# Patient Record
Sex: Male | Born: 1975 | Race: Black or African American | Hispanic: No | Marital: Married | Smoking: Former smoker
Health system: Southern US, Community
[De-identification: ages and names within clinical notes are randomized; demographics above are authoritative.]

## PROBLEM LIST (undated history)

## (undated) DIAGNOSIS — I10 Essential (primary) hypertension: Secondary | ICD-10-CM

## (undated) DIAGNOSIS — G8929 Other chronic pain: Secondary | ICD-10-CM

## (undated) HISTORY — PX: TONSILLECTOMY: SUR1361

## (undated) HISTORY — PX: TOE SURGERY: SHX1073

## (undated) HISTORY — PX: ACHILLES TENDON SURGERY: SHX542

---

## 2008-10-20 DIAGNOSIS — I10 Essential (primary) hypertension: Secondary | ICD-10-CM | POA: Insufficient documentation

## 2009-08-03 ENCOUNTER — Emergency Department: Payer: Self-pay | Admitting: Emergency Medicine

## 2018-07-01 ENCOUNTER — Encounter (HOSPITAL_COMMUNITY): Payer: Self-pay

## 2018-07-01 ENCOUNTER — Ambulatory Visit (HOSPITAL_COMMUNITY)
Admission: EM | Admit: 2018-07-01 | Discharge: 2018-07-01 | Disposition: A | Discharge status: Home / Self Care | Payer: Medicare (Managed Care) | Attending: Internal Medicine | Admitting: Internal Medicine

## 2018-07-01 ENCOUNTER — Other Ambulatory Visit: Payer: Self-pay

## 2018-07-01 DIAGNOSIS — M5441 Lumbago with sciatica, right side: Secondary | ICD-10-CM | POA: Insufficient documentation

## 2018-07-01 DIAGNOSIS — G8929 Other chronic pain: Secondary | ICD-10-CM

## 2018-07-01 MED ORDER — TIZANIDINE HCL 4 MG PO TABS: 4.0000 mg | ORAL_TABLET | Freq: Every day | ORAL | 0 refills | Status: DC

## 2018-07-01 MED ORDER — METHYLPREDNISOLONE SODIUM SUCC 125 MG IJ SOLR
INTRAMUSCULAR | Status: AC
  Filled 2018-07-01: qty 2

## 2018-07-01 MED ORDER — METHYLPREDNISOLONE SODIUM SUCC 125 MG IJ SOLR
125.0000 mg | Freq: Once | INTRAMUSCULAR | Status: AC
  Administered 2018-07-01: 125 mg via INTRAMUSCULAR

## 2018-07-01 NOTE — ED Triage Notes (Signed)
Pt cc lower back pain  And  The pain is  radiating down his leg.

## 2018-07-01 NOTE — ED Provider Notes (Signed)
[facility]   [id number] 07/01/18 Arrival Time: 1201  CC: Back pain  SUBJECTIVE: History from: patient. Jerry [name] is a 42 y.o. male complains of right sided low back pain for the past few days.  Denies a precipitating event or specific injury.  Localizes the pain to the RT low back and radiates down his left leg.  Describes the pain as intermittent and sharp in character.  Has tried OTC medications without relief.  Denies aggravating factors.  Reports similar symptoms in the past and diagnosed with sciaitica.  Denies fever, chills, erythema, ecchymosis, effusion, weakness, loss of bowel or bladder function, saddle paresthesias, numbness and tingling.      ROS: As per HPI.  History reviewed. No pertinent past medical history. History reviewed. No pertinent surgical history. Not on File No current facility-administered medications on file prior to encounter.    No current outpatient medications on file prior to encounter.   Social History   Socioeconomic History  . Marital status: Single    Spouse name: Not on file  . Number of children: Not on file  . Years of education: Not on file  . Highest education level: Not on file  Occupational History  . Not on file  Social Needs  . Financial resource strain: Not on file  . Food insecurity:    Worry: Not on file    Inability: Not on file  . Transportation needs:    Medical: Not on file    Non-medical: Not on file  Tobacco Use  . Smoking status: Never Smoker  . Smokeless tobacco: Never Used  Substance and Sexual Activity  . Alcohol use: Not on file  . Drug use: Not on file  . Sexual activity: Not on file  Lifestyle  . Physical activity:    Days per week: Not on file    Minutes per session: Not on file  . Stress: Not on file  Relationships  . Social connections:    Talks on phone: Not on file    Gets together: Not on file    Attends religious service: Not on file    Active member of club or organization:  Not on file    Attends meetings of clubs or organizations: Not on file    Relationship status: Not on file  . Intimate partner violence:    Fear of current or ex partner: Not on file    Emotionally abused: Not on file    Physically abused: Not on file    Forced sexual activity: Not on file  Other Topics Concern  . Not on file  Social History Narrative  . Not on file   History reviewed. No pertinent family history.  OBJECTIVE:  Vitals:   07/01/18 1342 07/01/18 1344  BP:  (!) 169/77  Pulse:  66  Resp:  20  Temp:  98.2 F (36.8 C)  SpO2:  100%  Weight: 280 lb (127 kg)     General appearance: Alert; in no acute distress.  Head: NCAT Lungs: CTA bilaterally Heart: RRR.  Clear S1 and S2 without murmur, gallops, or rubs.  Radial pulses 2+ bilaterally. Musculoskeletal: Lumbar spine Inspection: Skin warm, dry, clear and intact without obvious erythema, effusion, or ecchymosis.  Palpation: Midly tender over lumbar spine and TTP over the RT paravertebral muscles  ROM: FROM active and passive Strength: 5/5 shld abduction, 5/5 shld adduction, 5/5 elbow flexion, 5/5 elbow extension, 5/5 grip strength, 5/5 hip flexion, 5/5 knee abduction, 5/5 knee adduction, 5/5  knee flexion, 5/5 knee extension, 5/5 dorsiflexion, 5/5 plantar flexion Skin: warm and dry Neurologic: Ambulates without difficulty; Sensation intact about the upper/ lower extremities Psychological: alert and cooperative; normal mood and affect  ASSESSMENT & PLAN:  1. Chronic bilateral low back pain with right-sided sciatica     Meds ordered this encounter  Medications  . methylPREDNISolone sodium succinate (SOLU-MEDROL) 125 mg/2 mL injection 125 mg  . tiZANidine (ZANAFLEX) 4 MG tablet    Sig: Take 1 tablet (4 mg total) by mouth at bedtime.    Dispense:  15 tablet    Refill:  0    Order Specific Question:   Supervising Provider    Answer:   Eustace MooreELSON, YVONNE SUE [[id number]]   Shot given in office Continue conservative  management of rest, ice, heat, and gentle stretches Take zanaflex at nighttime for symptomatic relief. Avoid driving or operating heavy machinery while using medication. Follow up with PCP if symptoms persist Return or go to the ER if you have any new or worsening symptoms (fever, chills, chest pain, abdominal pain, changes in bowel or bladder habits, pain radiating into lower legs, etc...)   Reviewed expectations re: course of current medical issues. Questions answered. Outlined signs and symptoms indicating need for more acute intervention. Patient verbalized understanding. After Visit Summary given.    Rennis HardingWurst, Shivali Quackenbush, PA-C 07/01/18 1436

## 2018-07-01 NOTE — Discharge Instructions (Signed)
Shot given in office Continue conservative management of rest, ice, heat, and gentle stretches Take zanaflex at nighttime for symptomatic relief. Avoid driving or operating heavy machinery while using medication. Follow up with PCP if symptoms persist Return or go to the ER if you have any new or worsening symptoms (fever, chills, chest pain, abdominal pain, changes in bowel or bladder habits, pain radiating into lower legs, etc...)

## 2018-08-12 DIAGNOSIS — K219 Gastro-esophageal reflux disease without esophagitis: Secondary | ICD-10-CM | POA: Insufficient documentation

## 2018-11-20 ENCOUNTER — Ambulatory Visit (HOSPITAL_COMMUNITY): Payer: Medicare Other | Admitting: Licensed Clinical Social Worker

## 2018-11-29 ENCOUNTER — Ambulatory Visit (HOSPITAL_COMMUNITY): Payer: Medicare Other | Admitting: Licensed Clinical Social Worker

## 2018-11-29 ENCOUNTER — Other Ambulatory Visit: Payer: Self-pay

## 2018-12-06 ENCOUNTER — Ambulatory Visit (INDEPENDENT_AMBULATORY_CARE_PROVIDER_SITE_OTHER): Payer: Medicaid Other | Admitting: Licensed Clinical Social Worker

## 2018-12-06 DIAGNOSIS — F4322 Adjustment disorder with anxiety: Secondary | ICD-10-CM

## 2018-12-06 NOTE — Progress Notes (Signed)
Comprehensive Clinical Assessment (CCA) Note  12/06/2018 Jerry [name] [id number]   Virtual Visit via Video Note  I connected with Jerry [name] on 12/06/18 at  1:00 PM EDT by a video enabled telemedicine application and verified that I am speaking with the correct person using two identifiers.   I discussed the limitations of evaluation and management by telemedicine and the availability of in person appointments. The patient expressed understanding and agreed to proceed.  I discussed the assessment and treatment plan with the patient. The patient was provided an opportunity to ask questions and all were answered. The patient agreed with the plan and demonstrated an understanding of the instructions.   The patient was advised to call back or seek an in-person evaluation if the symptoms worsen or if the condition fails to improve as anticipated.  I provided 55 minutes of non-face-to-face time during this encounter.   Angus [place]ms, LCSW    Visit Diagnosis:      ICD-10-CM   1. Adjustment disorder with anxious mood F43.22     CCA Part One  Part One has been completed on paper by the patient.  (See scanned document in Chart Review)  CCA Part Two A  Intake/Chief Complaint:  CCA Intake With Chief Complaint CCA Part Two Date: 12/06/18 CCA Part Two Time: 1300 Chief Complaint/Presenting Problem: "a lot of stress," creating problems in marriage Patients Currently Reported Symptoms/Problems: feels stressed all the time, can't stop thinking about the problems or enjoy the good times Collateral Involvement: wife Individual's Strengths: motivated for positive growth, supportive family Individual's Abilities: motivating to others, ordained as a Optician, dispensing, sings in a gospel group Type of Services Patient Feels Are Needed: marriage counseling Initial Clinical Notes/Concerns: history of anxiety due to losses, considering leaving, his mother is very involved in [place] as well and  wants her concerns addressed  Mental Health Symptoms Depression:  Depression: N/A  Mania:  Mania: N/A  Anxiety:   Anxiety: Worrying  Psychosis:  Psychosis: N/A  Trauma:  Trauma: N/A  Obsessions:  Obsessions: N/A  Compulsions:  Compulsions: N/A  Inattention:  Inattention: N/A  Hyperactivity/Impulsivity:  Hyperactivity/Impulsivity: N/A  Oppositional/Defiant Behaviors:  Oppositional/Defiant Behaviors: N/A  Borderline Personality:  Emotional Irregularity: N/A  Other Mood/Personality Symptoms:      Family and Psychosocial History: Family history Marital status: Married Number of Years Married: 0.75 What types of issues is patient dealing with in the relationship?: living with mother-in-law, feels there is no time that is just for him and his wife, he has not saved up for them to have a place of their own and wife brings that up to him, feels less of a man,  feels like he may be ready to leave the marriage Additional relationship information: mother says that wife tries to control him, mother says his life is in [place] and he needs to be there but wife wants to stay near her mom Are you sexually active?: Yes What is your sexual orientation?: straight Has your sexual activity been affected by drugs, alcohol, medication, or emotional stress?: no Does patient have children?: Yes How many children?: 1 How is patient's relationship with their children?: Wife has one daughter and 2 grandkids, they have good relationship but pt feels they are there too much  Childhood History:  Childhood History By whom was/is the patient raised?: Mother, Grandparents Additional childhood history information: dad was in and out of the home, grew up in [place], grandparents helped mother raise him, raised in the church with  family members in the church, grandfather was Hotel managermilitary Description of patient's relationship with caregiver when they were a child: close with mother and grandparents Patient's description of  current relationship with people who raised him/her: still very close, mom has concerns about marriage and wants him to live in [place] again Does patient have siblings?: No Did patient suffer any verbal/emotional/physical/sexual abuse as a child?: No Did patient suffer from severe childhood neglect?: No Has patient ever been sexually abused/assaulted/raped as an adolescent or adult?: No Was the patient ever a victim of a crime or a disaster?: No Witnessed domestic violence?: No Has patient been effected by domestic violence as an adult?: No  CCA Part Two B  Employment/Work Situation: Employment / Work Psychologist, occupationalituation Employment situation: Employed Where is patient currently employed?: 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?: No Are There Guns or Other Weapons in Your Home?: No  Education: Education Last Grade Completed: 12 Did Garment/textile technologistYou Graduate From [employer] School?: Yes Did Theme park managerYou Attend College?: No(Job [employer] (trade school)) Did You Have An Individualized Education Program (IIEP): No Did You Have Any Difficulty At [employer]?: No  Religion: Religion/Spirituality Are You A Religious Person?: Yes What is Your Religious Affiliation?: [employer] How Might This Affect Treatment?: preaches and very focused on his faith  Leisure/Recreation: Leisure / Recreation Leisure and Hobbies: [employer] rap group, cooking out, working out, watching Bible stories or Theatre stage managerpreachers on his phone  Exercise/Diet: Exercise/Diet Do You Exercise?: Yes What Type of Exercise Do You Do?: Run/Walk How Many Times a Week Do You Exercise?: 1-3 times a week Have You Gained or Lost A Significant Amount of Weight in the Past Six Months?: No Do You Follow a Special Diet?: No Do You Have Any  Trouble Sleeping?: Yes Explanation of Sleeping Difficulties: hard to get to sleep  CCA Part Two C  Alcohol/Drug Use: None CCA Part Three  ASAM's:  Six Dimensions of Multidimensional Assessment  Dimension 1:  Acute Intoxication and/or Withdrawal Potential:     Dimension 2:  Biomedical Conditions and Complications:     Dimension 3:  Emotional, Behavioral, or Cognitive Conditions and Complications:     Dimension 4:  Readiness to Change:     Dimension 5:  Relapse, Continued use, or Continued Problem Potential:     Dimension 6:  Recovery/Living Environment:      Substance use Disorder (SUD)    Social Function:  Social Functioning Social Maturity: Responsible Social Judgement: Normal  Stress:  Stress Stressors: Family conflict, Housing Patient Takes Medications The Way The Doctor Instructed?: Yes Priority Risk: Low Acuity  Risk Assessment- Self-Harm Potential: Risk Assessment For Self-Harm Potential Thoughts of Self-Harm: No current thoughts Method: No plan Availability of Means: No access/NA  Risk Assessment -Dangerous to Others Potential: Risk Assessment For Dangerous to Others Potential Method: No Plan Availability of Means: No access or NA Intent: Vague intent or NA Notification Required: No need or identified person  DSM5 Diagnoses: There are no active problems to display for this patient.   Patient Centered Plan: Patient is on the following Treatment Plan(s):  Adjustment Disorder Treatment Plan  Recommendations for Services/Supports/Treatments: Recommendations for Services/Supports/Treatments Recommendations For Services/Supports/Treatments: Individual Therapy  Treatment Plan Summary: Increase capacity for healthy adjustment to current life circumstances   Angus Palmsegina Alexander

## 2019-02-15 ENCOUNTER — Other Ambulatory Visit: Payer: Self-pay | Admitting: Family Medicine

## 2019-02-15 DIAGNOSIS — Z20822 Contact with and (suspected) exposure to covid-19: Secondary | ICD-10-CM

## 2019-02-18 ENCOUNTER — Other Ambulatory Visit: Payer: Self-pay

## 2019-02-18 DIAGNOSIS — Z20822 Contact with and (suspected) exposure to covid-19: Secondary | ICD-10-CM

## 2019-02-19 LAB — NOVEL CORONAVIRUS, NAA: SARS-CoV-2, NAA: NOT DETECTED

## 2019-04-11 ENCOUNTER — Other Ambulatory Visit: Payer: Self-pay

## 2019-04-11 ENCOUNTER — Ambulatory Visit
Admission: EM | Admit: 2019-04-11 | Discharge: 2019-04-11 | Disposition: A | Discharge status: Home / Self Care | Payer: Medicare Other

## 2019-04-11 DIAGNOSIS — I10 Essential (primary) hypertension: Secondary | ICD-10-CM

## 2019-04-11 DIAGNOSIS — M5442 Lumbago with sciatica, left side: Secondary | ICD-10-CM

## 2019-04-11 DIAGNOSIS — M5441 Lumbago with sciatica, right side: Secondary | ICD-10-CM

## 2019-04-11 HISTORY — DX: Essential (primary) hypertension: I10

## 2019-04-11 MED ORDER — TIZANIDINE HCL 4 MG PO TABS: 4.0000 mg | ORAL_TABLET | Freq: Every evening | ORAL | 0 refills | Status: DC | PRN

## 2019-04-11 MED ORDER — METHYLPREDNISOLONE SODIUM SUCC 125 MG IJ SOLR
125.0000 mg | Freq: Once | INTRAMUSCULAR | Status: AC
  Administered 2019-04-11: 125 mg via INTRAMUSCULAR

## 2019-04-11 MED ORDER — TIZANIDINE HCL 4 MG PO TABS: 4.0000 mg | ORAL_TABLET | Freq: Every evening | ORAL | 0 refills | Status: AC | PRN

## 2019-04-11 MED ORDER — NAPROXEN 500 MG PO TABS: 500.0000 mg | ORAL_TABLET | Freq: Two times a day (BID) | ORAL | 0 refills | Status: DC

## 2019-04-11 NOTE — ED Triage Notes (Signed)
Pt presents to UC w/ c/o sciatic pain since x2 weeks. Pt reports pain in lower back that shoots down both legs. Pt has dealt with sciatic pain before.

## 2019-04-11 NOTE — Discharge Instructions (Signed)
Take muscle relaxer as needed for severe pain, spasm. May ice, rest, elevate the area(s) of pain.  You may also use hot compresses/warm wash rags to relieve muscle tightness. May use OTC Tylenol, ibuprofen as needed for pain. Return if you develop worsening pain, chest pain, difficulty breathing, change in bowel or bladder habit.

## 2019-04-11 NOTE — ED Provider Notes (Signed)
[facility]    CSN: [id number] Arrival date & time: 04/11/19  1116      History   Chief Complaint Chief Complaint  Patient presents with  . sciatic pain    HPI Jerry [name] is a 43 y.o. male history of hypertension presenting for 2-week course of bilateral low back pain with radiation down both legs to the back of his knees.  Patient states he seemed before for this: Record review done by me at time of appointment.  Patient seen in urgent care setting on 07/01/2018 for chronic bilateral low back pain with bilateral sciatica: Treated with prednisone burst, tizanidine.  Patient reports resolution of symptoms thereafter.  Patient denies trauma, inciting event for this flare.  Denies saddle area anesthesia, change in bowel or bladder habit.   Past Medical History:  Diagnosis Date  . Hypertension     There are no active problems to display for this patient.   Past Surgical History:  Procedure Laterality Date  . ACHILLES TENDON SURGERY    . TOE SURGERY         Home Medications    Prior to Admission medications   Medication Sig Start Date End Date Taking? Authorizing Provider  oxyCODONE-acetaminophen (PERCOCET/ROXICET) 5-325 MG tablet Take by mouth every 4 (four) hours as needed for severe pain.   Yes [provider]  naproxen (NAPROSYN) 500 MG tablet Take 1 tablet (500 mg total) by mouth 2 (two) times daily. 04/11/19   Hall-Potvin, [place], PA-C  tiZANidine (ZANAFLEX) 4 MG tablet Take 1 tablet (4 mg total) by mouth at bedtime as needed for up to 7 days for muscle spasms. 04/11/19 04/18/19  Hall-Potvin, [place], PA-C    Family History History reviewed. No pertinent family history.  Social History Social History   Tobacco Use  . Smoking status: Never Smoker  . Smokeless tobacco: Never Used  Substance Use Topics  . Alcohol use: Never    Frequency: Never  . Drug use: Not on file     Allergies   Patient has no known allergies.   Review  of Systems Review of Systems  Constitutional: Negative for fatigue and fever.  Respiratory: Negative for cough and shortness of breath.   Cardiovascular: Negative for chest pain and palpitations.  Musculoskeletal: Positive for back pain. Negative for neck pain.  Neurological: Negative for weakness and numbness.     Physical Exam Triage Vital Signs ED Triage Vitals  Enc Vitals Group     BP 04/11/19 1127 (!) 146/81     Pulse Rate 04/11/19 1127 85     Resp 04/11/19 1127 16     Temp 04/11/19 1127 (!) 97.4 F (36.3 C)     Temp Source 04/11/19 1127 Oral     SpO2 04/11/19 1127 97 %     Weight --      Height --      Head Circumference --      Peak Flow --      Pain Score 04/11/19 1136 9     Pain Loc --      Pain Edu? --      Excl. in GC? --    No data found.  Updated Vital Signs BP (!) 146/81 (BP Location: Left Arm)   Pulse 85   Temp (!) 97.4 F (36.3 C) (Oral)   Resp 16   SpO2 97%    Physical Exam Constitutional:      General: He is not in acute distress. HENT:  Head: Normocephalic and atraumatic.  Eyes:     General: No scleral icterus.    Pupils: Pupils are equal, round, and reactive to light.  Cardiovascular:     Rate and Rhythm: Normal rate.  Pulmonary:     Effort: Pulmonary effort is normal. No respiratory distress.     Breath sounds: No wheezing.  Musculoskeletal:     Comments: No obvious bony deformity or scoliosis of lumbar spine.  No PSIS tenderness.  Patient with bilateral paraspinal tenderness: No spinous process tenderness.  No fluctuance, warmth, rash appreciated.  Patient with bilateral gluteal TTP.  Negative straight leg raise bilaterally.  Skin:    General: Skin is warm.     Coloration: Skin is not jaundiced.     Findings: No bruising.  Neurological:     General: No focal deficit present.     Mental Status: He is alert.     Sensory: No sensory deficit.     Motor: No weakness.     Coordination: Coordination normal.     Deep Tendon Reflexes:  Reflexes normal.      UC Treatments / Results  Labs (all labs ordered are listed, but only abnormal results are displayed) Labs Reviewed - No data to display  EKG   Radiology No results found.  Procedures Procedures (including critical care time)  Medications Ordered in UC Medications  methylPREDNISolone sodium succinate (SOLU-MEDROL) 125 mg/2 mL injection 125 mg (125 mg Intramuscular Given 04/11/19 1210)    Initial Impression / Assessment and Plan / UC Course  I have reviewed the triage vital signs and the nursing notes.  Pertinent labs & imaging results that were available during my care of the patient were reviewed by me and considered in my medical decision making (see chart for details).     Patient given IM Solu-Medrol which he tolerated well.  Short-term, low-dose muscle relaxer refill provided.  Patient to take Naprosyn twice daily for the next week for additional relief.  Sports med rehab exercises provided as well as contact information for sports medicine should issue recur/become more frequent.  Return precautions discussed, patient verbalized understanding and is agreeable to plan. Final Clinical Impressions(s) / UC Diagnoses   Final diagnoses:  Acute bilateral low back pain with bilateral sciatica     Discharge Instructions     Take muscle relaxer as needed for severe pain, spasm. May ice, rest, elevate the area(s) of pain.  You may also use hot compresses/warm wash rags to relieve muscle tightness. May use OTC Tylenol, ibuprofen as needed for pain. Return if you develop worsening pain, chest pain, difficulty breathing, change in bowel or bladder habit.    ED Prescriptions    Medication Sig Dispense Auth. Provider   tiZANidine (ZANAFLEX) 4 MG tablet  (Status: Discontinued) Take 1 tablet (4 mg total) by mouth at bedtime as needed for up to 7 days for muscle spasms. 7 tablet Hall-Potvin, [place], PA-C   naproxen (NAPROSYN) 500 MG tablet Take 1 tablet  (500 mg total) by mouth 2 (two) times daily. 30 tablet Hall-Potvin, [place], PA-C   tiZANidine (ZANAFLEX) 4 MG tablet Take 1 tablet (4 mg total) by mouth at bedtime as needed for up to 7 days for muscle spasms. 7 tablet Hall-Potvin, [place], PA-C     PDMP not reviewed this encounter.   Neldon Mc [place], [place] 04/11/19 1938

## 2019-04-19 ENCOUNTER — Encounter: Payer: Self-pay | Admitting: Cardiology

## 2019-04-19 ENCOUNTER — Ambulatory Visit (INDEPENDENT_AMBULATORY_CARE_PROVIDER_SITE_OTHER): Payer: Medicare Other | Admitting: Cardiology

## 2019-04-19 ENCOUNTER — Other Ambulatory Visit: Payer: Self-pay

## 2019-04-19 VITALS — BP 152/96 | HR 74 | Ht 67.0 in | Wt 293.0 lb

## 2019-04-19 DIAGNOSIS — R9431 Abnormal electrocardiogram [ECG] [EKG]: Secondary | ICD-10-CM | POA: Diagnosis not present

## 2019-04-19 DIAGNOSIS — R0683 Snoring: Secondary | ICD-10-CM | POA: Diagnosis not present

## 2019-04-19 DIAGNOSIS — R600 Localized edema: Secondary | ICD-10-CM | POA: Diagnosis not present

## 2019-04-19 DIAGNOSIS — E119 Type 2 diabetes mellitus without complications: Secondary | ICD-10-CM | POA: Insufficient documentation

## 2019-04-19 DIAGNOSIS — I10 Essential (primary) hypertension: Secondary | ICD-10-CM

## 2019-04-19 MED ORDER — SPIRONOLACTONE 50 MG PO TABS: 50.0000 mg | ORAL_TABLET | Freq: Every day | ORAL | 3 refills | Status: DC

## 2019-04-19 NOTE — Patient Instructions (Signed)

## 2019-04-19 NOTE — Progress Notes (Signed)
Patient referred by Virgilio Belling, PA-C for hypertension  Subjective:   Jerry [name], male    DOB: [birth year], 43 y.o.   MRN: [id number]   Chief Complaint  Patient presents with  . Hypertension  . Abnormal ECG    HPI  43 y.o. African American male with uncontrolled hypertension, moderate obesity.  Patient is a Arts administrator, and used to be a Camera operator. He wants to get back into power lifting. He has had uncontrolled hypertension for almost 20 years. He has leg edema, but denies any chest pain, exertional dyspnea. He is trying to make diet and lifestyle changes. He endorses snoring, but has not had a sleep study.   Past Medical History:  Diagnosis Date  . Hypertension      Past Surgical History:  Procedure Laterality Date  . ACHILLES TENDON SURGERY    . TOE SURGERY       Social History   Socioeconomic History  . Marital status: Single    Spouse name: Not on file  . Number of children: Not on file  . Years of education: Not on file  . Highest education level: Not on file  Occupational History  . Not on file  Social Needs  . Financial resource strain: Not on file  . Food insecurity    Worry: Not on file    Inability: Not on file  . Transportation needs    Medical: Not on file    Non-medical: Not on file  Tobacco Use  . Smoking status: Never Smoker  . Smokeless tobacco: Never Used  Substance and Sexual Activity  . Alcohol use: Never    Frequency: Never  . Drug use: Not on file  . Sexual activity: Not on file  Lifestyle  . Physical activity    Days per week: Not on file    Minutes per session: Not on file  . Stress: Not on file  Relationships  . Social Musician on phone: Not on file    Gets together: Not on file    Attends religious service: Not on file    Active member of club or organization: Not on file    Attends meetings of clubs or organizations: Not on file    Relationship status: Not on file  . Intimate  partner violence    Fear of current or ex partner: Not on file    Emotionally abused: Not on file    Physically abused: Not on file    Forced sexual activity: Not on file  Other Topics Concern  . Not on file  Social History Narrative  . Not on file     Family History  Problem Relation Age of Onset  . Heart attack Maternal Grandmother   . Heart attack Paternal Grandmother      Current Outpatient Medications on File Prior to Visit  Medication Sig Dispense Refill  . naproxen (NAPROSYN) 500 MG tablet Take 1 tablet (500 mg total) by mouth 2 (two) times daily. 30 tablet 0  . oxyCODONE-acetaminophen (PERCOCET/ROXICET) 5-325 MG tablet Take by mouth every 4 (four) hours as needed for severe pain.     No current facility-administered medications on file prior to visit.     Cardiovascular studies:  EKG 04/19/2019:  Sinus rhythm 90 bpm. Incomplete right bundle branch block  Left anterior fascicular block.  Left ventricular hypertrophy Biatrial enlargement.  Poor R wave progression.  Recent labs: Not available.    Review of  Systems  Constitution: Negative for decreased appetite, malaise/fatigue, weight gain and weight loss.  HENT: Negative for congestion.   Eyes: Negative for visual disturbance.  Cardiovascular: Positive for leg swelling. Negative for chest pain, dyspnea on exertion, palpitations and syncope.  Respiratory: Negative for cough.   Endocrine: Negative for cold intolerance.  Hematologic/Lymphatic: Does not bruise/bleed easily.  Skin: Negative for itching and rash.  Musculoskeletal: Negative for myalgias.  Gastrointestinal: Negative for abdominal pain, nausea and vomiting.  Genitourinary: Negative for dysuria.  Neurological: Negative for dizziness and weakness.  Psychiatric/Behavioral: The patient is not nervous/anxious.   All other systems reviewed and are negative.        Vitals:   04/19/19 1340  BP: (!) 162/89  Pulse: 94  SpO2: 98%     Body mass  index is 45.89 kg/m. Filed Weights   04/19/19 1340  Weight: 293 lb (132.9 kg)     Objective:   Physical Exam  Constitutional: He is oriented to person, place, and time. He appears well-developed and well-nourished. No distress.  Morbidly obese  HENT:  Head: Normocephalic and atraumatic.  Eyes: Pupils are equal, round, and reactive to light. Conjunctivae are normal.  Neck: No JVD present.  Cardiovascular: Normal rate, regular rhythm and intact distal pulses.  No murmur heard. Pulmonary/Chest: Effort normal and breath sounds normal. He has no wheezes. He has no rales.  Abdominal: Soft. Bowel sounds are normal. There is no rebound.  Musculoskeletal:        General: Edema (1+ b/l) present.  Lymphadenopathy:    He has no cervical adenopathy.  Neurological: He is alert and oriented to person, place, and time. No cranial nerve deficit.  Skin: Skin is warm and dry.  Psychiatric: He has a normal mood and affect.  Nursing note and vitals reviewed.         Assessment & Recommendations:   43 y.o. African American male with uncontrolled hypertension, moderate obesity.  Hypertension: Possible secondary hypertension. 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 He has undergone workup with his PCP-including renin/angiotensins. Results not available to me. Recommend echocardiogram, renal artery duplex, and referral to sleep study. Continue current antihypertensive therapy + added spironolactone 50 mg daily. BMP in 1 week. F/ in 4 weeks.   Discussed diet and lifestyle changes, including portion control, low salt diet, regular exercise.    Thank you for referring the patient to [place]. Please feel free to contact with any questions.  Nigel Mormon, MD [facility] Pager: [phone] Office: [phone] If no answer Cell [phone]

## 2019-04-21 ENCOUNTER — Encounter: Payer: Self-pay | Admitting: Cardiology

## 2019-04-23 NOTE — Progress Notes (Signed)
Aldosterone results are not back on this scan. Can you follow up please?  Thanks MJP

## 2019-04-26 ENCOUNTER — Other Ambulatory Visit: Payer: Self-pay

## 2019-04-26 ENCOUNTER — Ambulatory Visit (INDEPENDENT_AMBULATORY_CARE_PROVIDER_SITE_OTHER): Payer: Medicare Other

## 2019-04-26 DIAGNOSIS — I10 Essential (primary) hypertension: Secondary | ICD-10-CM | POA: Diagnosis not present

## 2019-04-27 LAB — BASIC METABOLIC PANEL
BUN/Creatinine Ratio: 17 (ref 9–20)
BUN: 20 mg/dL (ref 6–24)
CO2: 21 mmol/L (ref 20–29)
Calcium: 9.3 mg/dL (ref 8.7–10.2)
Chloride: 105 mmol/L (ref 96–106)
Creatinine, Ser: 1.17 mg/dL (ref 0.76–1.27)
GFR calc Af Amer: 87 mL/min/{1.73_m2} (ref >59)
GFR calc non Af Amer: 75 mL/min/{1.73_m2} (ref >59)
Glucose: 86 mg/dL (ref 65–99)
Potassium: 3.8 mmol/L (ref 3.5–5.2)
Sodium: 138 mmol/L (ref 134–144)

## 2019-04-29 NOTE — Progress Notes (Signed)
Left detailed vm °

## 2019-05-01 ENCOUNTER — Telehealth: Payer: Self-pay

## 2019-05-01 NOTE — Telephone Encounter (Signed)
-----   Message from [facility], MD sent at 04/28/2019  9:10 PM EDT ----- Could not visualize renal arteries due to body habitus.  Thanks MJP

## 2019-05-16 NOTE — Progress Notes (Signed)
Patient referred by Andria Frames, PA-C for hypertension  Subjective:   Jerry [name], male    DOB: 1976-04-03, 43 y.o.   MRN: [id number]   Chief Complaint  Patient presents with  . Hypertension  . Leg Swelling  . Follow-up    4 week    HPI   43 y.o. African American male with uncontrolled hypertension, moderate obesity.  Echocardiogram showed normal EF, grade 1 diastolic dysfunction, no significant valvular abnormality.  Renal artery duplex was limited in its assessment due to body habitus. Aldosterone was normal. Sleep study is pending.   Blood pressure is improved, although not normal. He has made changes to his lifestyle  with diet and regular exercise. Leg edema has significantly improved. BFNG    Initial consult note (04/19/2019): Patient is a Agricultural consultant, and used to be a power lifter. He wants to get back into power lifting. He has had uncontrolled hypertension for almost 20 years. He has leg edema, but denies any chest pain, exertional dyspnea. He is trying to make diet and lifestyle changes. He endorses snoring, but has not had a sleep study.   Past Medical History:  Diagnosis Date  . Hypertension      Past Surgical History:  Procedure Laterality Date  . ACHILLES TENDON SURGERY    . TOE SURGERY       Social History   Socioeconomic History  . Marital status: Married    Spouse name: Not on file  . Number of children: 0  . Years of education: Not on file  . Highest education level: Not on file  Occupational History  . Not on file  Social Needs  . Financial resource strain: Not on file  . Food insecurity    Worry: Not on file    Inability: Not on file  . Transportation needs    Medical: Not on file    Non-medical: Not on file  Tobacco Use  . Smoking status: Former Smoker    Years: 1.00    Types: Cigarettes  . Smokeless tobacco: Never Used  . Tobacco comment: few a day   Substance and Sexual Activity  . Alcohol use: Never   Frequency: Never  . Drug use: Never  . Sexual activity: Not on file  Lifestyle  . Physical activity    Days per week: Not on file    Minutes per session: Not on file  . Stress: Not on file  Relationships  . Social Herbalist on phone: Not on file    Gets together: Not on file    Attends religious service: Not on file    Active member of club or organization: Not on file    Attends meetings of clubs or organizations: Not on file    Relationship status: Not on file  . Intimate partner violence    Fear of current or ex partner: Not on file    Emotionally abused: Not on file    Physically abused: Not on file    Forced sexual activity: Not on file  Other Topics Concern  . Not on file  Social History Narrative  . Not on file     Family History  Problem Relation Age of Onset  . Heart attack Maternal Grandmother   . Heart attack Paternal Grandmother      Current Outpatient Medications on File Prior to Visit  Medication Sig Dispense Refill  . amLODipine (NORVASC) 5 MG tablet Take 5 mg by  mouth daily.    . cetirizine (ZYRTEC) 10 MG tablet Take 10 mg by mouth daily.    Marland Kitchen DICLOFENAC PO Take by mouth.    . ergocalciferol (VITAMIN D2) 1.25 MG (50000 UT) capsule Take 50,000 Units by mouth once a week.    . hydrALAZINE (APRESOLINE) 100 MG tablet Take 100 mg by mouth 2 (two) times daily.     Marland Kitchen losartan (COZAAR) 100 MG tablet Take 100 mg by mouth daily.    Marland Kitchen omeprazole (PRILOSEC) 40 MG capsule Take 40 mg by mouth daily.    Marland Kitchen spironolactone (ALDACTONE) 50 MG tablet Take 1 tablet (50 mg total) by mouth daily. 30 tablet 3  . tamsulosin (FLOMAX) 0.4 MG CAPS capsule Take 0.4 mg by mouth daily.     No current facility-administered medications on file prior to visit.     Cardiovascular studies:  Echocardiogram 04/26/2019: Left ventricle cavity is normal in size. Moderate concentric hypertrophy of the left ventricle. Normal LV systolic function with EF 59%. Normal global wall  motion. Doppler evidence of grade I (impaired) diastolic dysfunction, normal LAP.  No significant valvular abnormality.  Normal right atrial pressure.   Renal artery duplex  04/26/2019: Renal length is within normal limits for both kidneys. Normal abdominal aorta flow velocities noted. Renal artery Dopplers were unobtainable due to poor image quality due to patient's body habitus. Patient 5'7'' and 300 pounds.  EKG 04/19/2019:  Sinus rhythm 90 bpm. Incomplete right bundle branch block  Left anterior fascicular block.  Left ventricular hypertrophy Biatrial enlargement.  Poor R wave progression.  Recent labs: 04/26/2019: Glucose 86. BUN/Cr 20/1.17. eGFR 87. Na/K 138/3.8.  04/05/2019: Aldosterone 14 ng/dL, normal   Review of Systems  Constitution: Negative for decreased appetite, malaise/fatigue, weight gain and weight loss.  HENT: Negative for congestion.   Eyes: Negative for visual disturbance.  Cardiovascular: Positive for leg swelling. Negative for chest pain, dyspnea on exertion, palpitations and syncope.  Respiratory: Negative for cough.   Endocrine: Negative for cold intolerance.  Hematologic/Lymphatic: Does not bruise/bleed easily.  Skin: Negative for itching and rash.  Musculoskeletal: Negative for myalgias.  Gastrointestinal: Negative for abdominal pain, nausea and vomiting.  Genitourinary: Negative for dysuria.  Neurological: Negative for dizziness and weakness.  Psychiatric/Behavioral: The patient is not nervous/anxious.   All other systems reviewed and are negative.        Vitals:   05/24/19 1437  BP: (!) 151/81  Pulse: 84  SpO2: 98%     Body mass index is 44.89 kg/m. Filed Weights   05/24/19 1437  Weight: 286 lb 9.6 oz (130 kg)     Objective:   Physical Exam  Constitutional: He is oriented to person, place, and time. He appears well-developed and well-nourished. No distress.  Morbidly obese  HENT:  Head: Normocephalic and atraumatic.  Eyes:  Pupils are equal, round, and reactive to light. Conjunctivae are normal.  Neck: No JVD present.  Cardiovascular: Normal rate, regular rhythm and intact distal pulses.  No murmur heard. Pulmonary/Chest: Effort normal and breath sounds normal. He has no wheezes. He has no rales.  Abdominal: Soft. Bowel sounds are normal. There is no rebound.  Musculoskeletal:        General: Edema (Trace  b/l) present.  Lymphadenopathy:    He has no cervical adenopathy.  Neurological: He is alert and oriented to person, place, and time. No cranial nerve deficit.  Skin: Skin is warm and dry.  Psychiatric: He has a normal mood and affect.  Nursing note  and vitals reviewed.         Assessment & Recommendations:   43 y.o. African American male with uncontrolled hypertension, moderate obesity.  Hypertension: No secondary hypertension cause found.  Suboptimal, but much improved.  Continue current antihypertensive therapy. Encourage undergoing sleep study. Congratulated him on weight loss. Continue regular physical activity.  I will see him on as needed basis.   Nigel Mormon, MD [facility] Pager: [phone] Office: [phone] If no answer Cell [phone]

## 2019-05-20 ENCOUNTER — Ambulatory Visit: Payer: Medicaid Other | Admitting: Neurology

## 2019-05-20 ENCOUNTER — Other Ambulatory Visit: Payer: Self-pay

## 2019-05-20 ENCOUNTER — Encounter: Payer: Self-pay | Admitting: Neurology

## 2019-05-20 VITALS — BP 172/102 | HR 81 | Temp 97.0°F | Ht 67.0 in | Wt 288.0 lb

## 2019-05-20 DIAGNOSIS — G4719 Other hypersomnia: Secondary | ICD-10-CM

## 2019-05-20 DIAGNOSIS — Z6841 Body Mass Index (BMI) 40.0 and over, adult: Secondary | ICD-10-CM

## 2019-05-20 DIAGNOSIS — R351 Nocturia: Secondary | ICD-10-CM | POA: Diagnosis not present

## 2019-05-20 DIAGNOSIS — G4733 Obstructive sleep apnea (adult) (pediatric): Secondary | ICD-10-CM

## 2019-05-20 DIAGNOSIS — R002 Palpitations: Secondary | ICD-10-CM

## 2019-05-20 NOTE — Patient Instructions (Signed)

## 2019-05-20 NOTE — Progress Notes (Signed)
Subjective:    Patient ID: TRACKER MANCE is a 43 y.o. male.  HPI     Star Age, MD, PhD [facility] [address], [address] [place], [place] [address]   Dear Dr. Virgina Jock,  I saw your patient, Stephfon Bovey, upon your kind request to my sleep clinic today for initial consultation of his sleep disorder, in particular, concern for underlying obstructive sleep apnea.  The patient is accompanied by his wife today.  As you know, Mr. Reesman is a 43 year old right-handed gentleman with an underlying medical history of hypertension, abnormal EKG, and morbid obesity with a BMI of over 40, who reports snoring and excessive daytime somnolence and a prior Dx of OSA.  I reviewed your office note from 04/19/2019.  He had a sleep study several years ago and was given a CPAP machine.  He believes sleep study testing was in [place].  It looks like he had a baseline sleep study in September 2016 but I could not get to the results.  He says that he either lost or misplaced his CPAP machine.  When he was on it in the past he did feel an improvement.  He endorses restless sleep and sleep disruption, has significant nocturia about 4-5 times per average night.  His wife reports that he snores loudly and also has pauses in his breathing.  Epworth sleepiness score is 13 out of 24, fatigue severity score is 38 out of 63.  He denies recurrent morning headaches or a family history of sleep apnea.  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  He is a non-smoker.  He drinks alcohol very occasionally.  He does not drink caffeine on a day-to-day basis, has reduced his soda intake quite significantly.  He tries to be in bed between 930 and 10 and rise time is around 530.  He lives with his wife and mother-in-law, he has no biological children.    His Past Medical History Is Significant For: Past Medical History:  Diagnosis Date  . Hypertension     His Past Surgical  History Is Significant For: Past Surgical History:  Procedure Laterality Date  . ACHILLES TENDON SURGERY    . TOE SURGERY      His Family History Is Significant For: Family History  Problem Relation Age of Onset  . Heart attack Maternal Grandmother   . Heart attack Paternal Grandmother     His Social History Is Significant For: Social History   Socioeconomic History  . Marital status: Married    Spouse name: Not on file  . Number of children: 0  . Years of education: Not on file  . Highest education level: Not on file  Occupational History  . Not on file  Social Needs  . Financial resource strain: Not on file  . Food insecurity    Worry: Not on file    Inability: Not on file  . Transportation needs    Medical: Not on file    Non-medical: Not on file  Tobacco Use  . Smoking status: Former Smoker    Years: 1.00    Types: Cigarettes  . Smokeless tobacco: Never Used  . Tobacco comment: few a day   Substance and Sexual Activity  . Alcohol use: Never    Frequency: Never  . Drug use: Never  . Sexual activity: Not on file  Lifestyle  . Physical activity    Days per week: Not on file    Minutes  per session: Not on file  . Stress: Not on file  Relationships  . Social Musician on phone: Not on file    Gets together: Not on file    Attends religious service: Not on file    Active member of club or organization: Not on file    Attends meetings of clubs or organizations: Not on file    Relationship status: Not on file  Other Topics Concern  . Not on file  Social History Narrative  . Not on file    His Allergies Are:  No Known Allergies:   His Current Medications Are:  Outpatient Encounter Medications as of 05/20/2019  Medication Sig  . amLODipine (NORVASC) 5 MG tablet Take 5 mg by mouth daily.  . cetirizine (ZYRTEC) 10 MG tablet Take 10 mg by mouth daily.  Marland Kitchen DICLOFENAC PO Take by mouth.  . ergocalciferol (VITAMIN D2) 1.25 MG (50000 UT) capsule Take  50,000 Units by mouth once a week.  . hydrALAZINE (APRESOLINE) 100 MG tablet Take 100 mg by mouth 2 (two) times daily.   Marland Kitchen losartan (COZAAR) 100 MG tablet Take 100 mg by mouth daily.  Marland Kitchen omeprazole (PRILOSEC) 40 MG capsule Take 40 mg by mouth daily.  Marland Kitchen spironolactone (ALDACTONE) 50 MG tablet Take 1 tablet (50 mg total) by mouth daily.  . tamsulosin (FLOMAX) 0.4 MG CAPS capsule Take 0.4 mg by mouth daily.  . [DISCONTINUED] amLODipine (NORVASC) 10 MG tablet Take 10 mg by mouth daily.  . [DISCONTINUED] naproxen (NAPROSYN) 500 MG tablet Take 1 tablet (500 mg total) by mouth 2 (two) times daily.  . [DISCONTINUED] omeprazole (PRILOSEC) 20 MG capsule Take 20 mg by mouth daily.  . [DISCONTINUED] oxyCODONE-acetaminophen (PERCOCET/ROXICET) 5-325 MG tablet Take by mouth every 4 (four) hours as needed for severe pain.   No facility-administered encounter medications on file as of 05/20/2019.   :  Review of Systems:  Out of a complete 14 point review of systems, all are reviewed and negative with the exception of these symptoms as listed below: Review of Systems  Neurological:       Pt presents today to discuss his sleep. Pt has had a sleep study about 5 years ago and started on cpap but stopped using it and does not have it any longer. Pt does endorse snoring.  Epworth Sleepiness Scale 0= would never doze 1= slight chance of dozing 2= moderate chance of dozing 3= high chance of dozing  Sitting and reading: 2 Watching TV: 2 Sitting inactive in a public place (ex. Theater or meeting): 2 As a passenger in a car for an hour without a break: 1 Lying down to rest in the afternoon: 3 Sitting and talking to someone: 0 Sitting quietly after lunch (no alcohol): 3 In a car, while stopped in traffic: 0 Total: 13     Objective:  Neurological Exam  Physical Exam Physical Examination:   Vitals:   05/20/19 0905  BP: (!) 172/102  Pulse: 81  Temp: (!) 97 F (36.1 C)    General Examination: The  patient is a very pleasant 43 y.o. male in no acute distress. He appears well-developed and well-nourished and well groomed.   HEENT: Normocephalic, atraumatic, pupils are equal, round and reactive to light, extraocular tracking is Well preserved, no nystagmus, hearing is grossly intact.  Face is symmetric with normal facial animation, speech is clear without dysarthria, hypophonia or voice tremor.  There are no carotid bruits.  Airway examination reveals mild  mouth dryness, adequate dental hygiene, minimal overbite, moderate airway crowding secondary to smaller airway entry and wider uvula.  Tongue protrudes centrally in palate elevates symmetrically.  Neck circumference is 18-1/4 inches.  Chest: Clear to auscultation without wheezing, rhonchi or crackles noted.  Heart: S1+S2+0, regular and normal without murmurs, rubs or gallops noted.   Abdomen: Soft, non-tender and non-distended with normal bowel sounds appreciated on auscultation.  Extremities: There is trace edema in the distal lower extremities bilaterally.   Skin: Warm and dry without trophic changes noted.   Musculoskeletal: exam reveals no obvious joint deformities, tenderness or joint swelling or erythema.   Neurologically:  Mental status: The patient is awake, alert and oriented in all 4 spheres. His immediate and remote memory, attention, language skills and fund of knowledge are appropriate. There is no evidence of aphasia, agnosia, apraxia or anomia. Speech is clear with normal prosody and enunciation. Thought process is linear. Mood is normal and affect is normal.  Cranial nerves II - XII are as described above under HEENT exam.  Motor exam: Normal bulk, strength and tone is noted. There is no tremor. Romberg is negative. Fine motor skills and coordination: grossly intact.  Cerebellar testing: No dysmetria or intention tremor on finger to nose testing. Heel to shin is unremarkable bilaterally. There is no truncal or gait ataxia.   Sensory exam: intact to light touch in the upper and lower extremities.  Gait, station and balance: He stands easily. No veering to one side is noted. No leaning to one side is noted. Posture is age-appropriate and stance is narrow based. Gait shows normal stride length and normal pace. No problems turning are noted. Tandem walk is challenging, but doable.   Assessment and Plan:  In summary, Roshawn Hinton DyerJ Beckum is a very pleasant 43 y.o.-year old male s wife today.  As you know, Mr. Marvel PlanWhitehead is a 43 year old right-handed gentleman with an underlying medical history of hypertension, abnormal EKG, and morbid obesity with a BMI of over 40, whose history and physical exam are concerning for obstructive sleep apnea (OSA). I had a long chat with the patient and his wife about my findings and the diagnosis of OSA, its prognosis and treatment options. We talked about medical treatments, surgical interventions and non-pharmacological approaches. I explained in particular the risks and ramifications of untreated moderate to severe OSA, especially with respect to developing cardiovascular disease down the Road, including congestive heart failure, difficult to treat hypertension, cardiac arrhythmias, or stroke. Even type 2 diabetes has, in part, been linked to untreated OSA. Symptoms of untreated OSA include daytime sleepiness, memory problems, mood irritability and mood disorder such as depression and anxiety, lack of energy, as well as recurrent headaches, especially morning headaches. We talked about trying to maintain a healthy lifestyle in general, as well as the importance of weight control. We also talked about the importance of good sleep hygiene. I recommended the following at this time: sleep study.    I explained the sleep test procedure to the patient and also outlined possible surgical and non-surgical treatment options of OSA, including the use of a custom-made dental device (which would require a  referral to a specialist dentist or oral surgeon), upper airway surgical options, such as traditional UPPP or a novel less invasive surgical option in the form of Inspire hypoglossal nerve stimulation (which would involve a referral to an ENT surgeon). I also explained the CPAP treatment option to the patient, who indicated that he would be willing to try  CPAP if the need arises. I explained the importance of being compliant with PAP treatment, not only for insurance purposes but primarily to improve His symptoms, and for the patient's long term health benefit, including to reduce His cardiovascular risks. I answered all their questions today and the patient and his wife were in agreement. I plan to see him back after the sleep study is completed and encouraged him to call with any interim questions, concerns, problems or updates.   Thank you very much for allowing me to participate in the care of this nice patient. If I can be of any further assistance to you please do not hesitate to call me at [phone].  Sincerely,   Huston Foley, MD, PhD

## 2019-05-24 ENCOUNTER — Other Ambulatory Visit: Payer: Self-pay

## 2019-05-24 ENCOUNTER — Ambulatory Visit (INDEPENDENT_AMBULATORY_CARE_PROVIDER_SITE_OTHER): Payer: Medicare Other | Admitting: Cardiology

## 2019-05-24 ENCOUNTER — Encounter: Payer: Self-pay | Admitting: Cardiology

## 2019-05-24 VITALS — BP 151/81 | HR 84 | Ht 67.0 in | Wt 286.6 lb

## 2019-05-24 DIAGNOSIS — I10 Essential (primary) hypertension: Secondary | ICD-10-CM | POA: Diagnosis not present

## 2019-05-24 DIAGNOSIS — R600 Localized edema: Secondary | ICD-10-CM | POA: Insufficient documentation

## 2019-06-07 ENCOUNTER — Emergency Department (HOSPITAL_COMMUNITY)
Admission: EM | Admit: 2019-06-07 | Discharge: 2019-06-07 | Disposition: A | Discharge status: Home / Self Care | Payer: Medicare Other | Attending: Emergency Medicine | Admitting: Emergency Medicine

## 2019-06-07 ENCOUNTER — Encounter (HOSPITAL_COMMUNITY): Payer: Self-pay | Admitting: Emergency Medicine

## 2019-06-07 ENCOUNTER — Other Ambulatory Visit: Payer: Self-pay

## 2019-06-07 ENCOUNTER — Emergency Department (HOSPITAL_COMMUNITY): Payer: Medicare Other

## 2019-06-07 DIAGNOSIS — I10 Essential (primary) hypertension: Secondary | ICD-10-CM | POA: Diagnosis not present

## 2019-06-07 DIAGNOSIS — Z79899 Other long term (current) drug therapy: Secondary | ICD-10-CM | POA: Diagnosis not present

## 2019-06-07 DIAGNOSIS — R42 Dizziness and giddiness: Secondary | ICD-10-CM | POA: Diagnosis not present

## 2019-06-07 DIAGNOSIS — R0602 Shortness of breath: Secondary | ICD-10-CM | POA: Diagnosis present

## 2019-06-07 DIAGNOSIS — Z87891 Personal history of nicotine dependence: Secondary | ICD-10-CM | POA: Diagnosis not present

## 2019-06-07 DIAGNOSIS — R0789 Other chest pain: Secondary | ICD-10-CM | POA: Diagnosis not present

## 2019-06-07 LAB — URINALYSIS, ROUTINE W REFLEX MICROSCOPIC
Bacteria, UA: NONE SEEN
Bilirubin Urine: NEGATIVE
Glucose, UA: NEGATIVE mg/dL
Hgb urine dipstick: NEGATIVE
Ketones, ur: NEGATIVE mg/dL
Leukocytes,Ua: NEGATIVE
Nitrite: NEGATIVE
Protein, ur: 30 mg/dL — AB
Specific Gravity, Urine: 1.024 (ref 1.005–1.030)
pH: 7 (ref 5.0–8.0)

## 2019-06-07 LAB — CBG MONITORING, ED: Glucose-Capillary: 94 mg/dL (ref 70–99)

## 2019-06-07 LAB — CBC
HCT: 40 % (ref 39.0–52.0)
Hemoglobin: 13.3 g/dL (ref 13.0–17.0)
MCH: 28.7 pg (ref 26.0–34.0)
MCHC: 33.3 g/dL (ref 30.0–36.0)
MCV: 86.2 fL (ref 80.0–100.0)
Platelets: 198 10*3/uL (ref 150–400)
RBC: 4.64 MIL/uL (ref 4.22–5.81)
RDW: 14.8 % (ref 11.5–15.5)
WBC: 5.4 10*3/uL (ref 4.0–10.5)
nRBC: 0 % (ref 0.0–0.2)

## 2019-06-07 LAB — BRAIN NATRIURETIC PEPTIDE: B Natriuretic Peptide: 40.1 pg/mL (ref 0.0–100.0)

## 2019-06-07 LAB — COMPREHENSIVE METABOLIC PANEL
ALT: 27 U/L (ref 0–44)
AST: 19 U/L (ref 15–41)
Albumin: 3.6 g/dL (ref 3.5–5.0)
Alkaline Phosphatase: 66 U/L (ref 38–126)
Anion gap: 9 (ref 5–15)
BUN: 15 mg/dL (ref 6–20)
CO2: 23 mmol/L (ref 22–32)
Calcium: 9.1 mg/dL (ref 8.9–10.3)
Chloride: 109 mmol/L (ref 98–111)
Creatinine, Ser: 1.19 mg/dL (ref 0.61–1.24)
GFR calc Af Amer: >60 mL/min (ref >60)
GFR calc non Af Amer: >60 mL/min (ref >60)
Glucose, Bld: 101 mg/dL — ABNORMAL HIGH (ref 70–99)
Potassium: 3.8 mmol/L (ref 3.5–5.1)
Sodium: 141 mmol/L (ref 135–145)
Total Bilirubin: 0.8 mg/dL (ref 0.3–1.2)
Total Protein: 7.2 g/dL (ref 6.5–8.1)

## 2019-06-07 LAB — TROPONIN I (HIGH SENSITIVITY): Troponin I (High Sensitivity): 8 ng/L (ref <18)

## 2019-06-07 NOTE — ED Notes (Signed)
Pt ambulated with no difficulty and given gingerale to drink.

## 2019-06-07 NOTE — ED Provider Notes (Signed)
[facility] EMERGENCY DEPARTMENT Provider Note   CSN: [id number] Arrival date & time: 06/07/19  1013     History   Chief Complaint Chief Complaint  Patient presents with  . Shortness of Breath  . Chest Pain    HPI Jerry [name] is a 43 y.o. male.     Patient c/o feeling lightheaded at work today, as if was going to pass out - called EMS who transported to ED - was found to be in sinus rhythm, with bp mildly elevated. Symptoms acute onset, moderate, persistent, worse when standing. States then chest felt painful, dull to sharp, mid chest, non radiating, constant, at rest, not pleuritic. Denies any recent exertional cp or discomfort. States mild sob in past day. Denies cough or uri symptoms. No fever or chills. No known covid+ exposure. No increased leg pain or swelling. States compliant w home meds, no recent change in meds. States recent increased stress/anxiety w mortgage issues, living w inlaw in home, etc. Denies depression or thoughts of self harm. Denies palpitations or rapid heart beat. Denies hx cad. Denies fam hx premature cad.   The history is provided by the patient.  Shortness of Breath Associated symptoms: chest pain   Associated symptoms: no abdominal pain, no cough, no fever, no headaches, no neck pain, no rash, no sore throat and no vomiting   Chest Pain Associated symptoms: shortness of breath   Associated symptoms: no abdominal pain, no back pain, no cough, no fever, no headache, no palpitations and no vomiting     Past Medical History:  Diagnosis Date  . Hypertension     Patient Active Problem List   Diagnosis Date Noted  . Leg edema 05/24/2019  . Diabetes mellitus (HCC) 04/19/2019  . Gastroesophageal reflux disease without esophagitis 08/12/2018  . Obesity, morbid, BMI 50 or higher (HCC) 09/15/2014  . Essential hypertension 10/20/2008    Past Surgical History:  Procedure Laterality Date  . ACHILLES TENDON SURGERY    . TOE  SURGERY          Home Medications    Prior to Admission medications   Medication Sig Start Date End Date Taking? Authorizing Provider  amLODipine (NORVASC) 5 MG tablet Take 5 mg by mouth daily.    [provider]  cetirizine (ZYRTEC) 10 MG tablet Take 10 mg by mouth daily.    [provider]  DICLOFENAC PO Take by mouth.    [provider]  ergocalciferol (VITAMIN D2) 1.25 MG (50000 UT) capsule Take 50,000 Units by mouth once a week.    [provider]  hydrALAZINE (APRESOLINE) 100 MG tablet Take 100 mg by mouth 2 (two) times daily.     [provider]  losartan (COZAAR) 100 MG tablet Take 100 mg by mouth daily.    [provider]  omeprazole (PRILOSEC) 40 MG capsule Take 40 mg by mouth daily.    [provider]  spironolactone (ALDACTONE) 50 MG tablet Take 1 tablet (50 mg total) by mouth daily. 04/19/19   Patwardhan, Anabel Bene, MD  tamsulosin (FLOMAX) 0.4 MG CAPS capsule Take 0.4 mg by mouth daily.    [provider]    Family History Family History  Problem Relation Age of Onset  . Heart attack Maternal Grandmother   . Heart attack Paternal Grandmother     Social History Social History   Tobacco Use  . Smoking status: Former Smoker    Years: 1.00    Types: Cigarettes  .  Smokeless tobacco: Never Used  . Tobacco comment: few a day   Substance Use Topics  . Alcohol use: Never    Frequency: Never  . Drug use: Never     Allergies   Patient has no known allergies.   Review of Systems Review of Systems  Constitutional: Negative for fever.  HENT: Negative for sore throat.   Eyes: Negative for visual disturbance.  Respiratory: Positive for shortness of breath. Negative for cough.   Cardiovascular: Positive for chest pain. Negative for palpitations and leg swelling.  Gastrointestinal: Negative for abdominal pain, blood in stool, diarrhea and vomiting.  Endocrine: Negative for polyuria.   Genitourinary: Negative for dysuria and flank pain.  Musculoskeletal: Negative for back pain and neck pain.  Skin: Negative for rash.  Neurological: Positive for light-headedness. Negative for headaches.  Hematological: Does not bruise/bleed easily.  Psychiatric/Behavioral: Negative for confusion.     Physical Exam Updated Vital Signs BP (!) 162/89 (BP Location: Right Arm)   Pulse 75   Temp 98 F (36.7 C) (Oral)   Resp 14   Ht 1.676 m (5\' 6" )   Wt 120.2 kg   SpO2 100%   BMI 42.77 kg/m   Physical Exam Vitals signs and nursing note reviewed.  Constitutional:      Appearance: Normal appearance. He is well-developed.  HENT:     Head: Atraumatic.     Nose: Nose normal.     Mouth/Throat:     Mouth: Mucous membranes are moist.     Pharynx: Oropharynx is clear.  Eyes:     General: No scleral icterus.    Conjunctiva/sclera: Conjunctivae normal.     Pupils: Pupils are equal, round, and reactive to light.  Neck:     Musculoskeletal: Normal range of motion and neck supple. No neck rigidity.     Trachea: No tracheal deviation.  Cardiovascular:     Rate and Rhythm: Normal rate and regular rhythm.     Pulses: Normal pulses.     Heart sounds: Normal heart sounds. No murmur. No friction rub. No gallop.   Pulmonary:     Effort: Pulmonary effort is normal. No accessory muscle usage or respiratory distress.     Breath sounds: Normal breath sounds.  Chest:     Chest wall: Tenderness present.  Abdominal:     General: Bowel sounds are normal. There is no distension.     Palpations: Abdomen is soft.     Tenderness: There is no abdominal tenderness. There is no guarding.  Genitourinary:    Comments: No cva tenderness. Musculoskeletal:        General: No swelling or tenderness.     Right lower leg: No edema.     Left lower leg: No edema.  Skin:    General: Skin is warm and dry.     Findings: No rash.  Neurological:     Mental Status: He is alert.     Comments: Alert, speech  clear. Motor/sens grossly intact bil.   Psychiatric:     Comments: Mildly anxious/stressed appearing.       ED Treatments / Results  Labs (all labs ordered are listed, but only abnormal results are displayed) Results for orders placed or performed during the hospital encounter of 06/07/19  CBC  Result Value Ref Range   WBC 5.4 4.0 - 10.5 K/uL   RBC 4.64 4.22 - 5.81 MIL/uL   Hemoglobin 13.3 13.0 - 17.0 g/dL   HCT 16.140.0 09.639.0 - 04.552.0 %   MCV  86.2 80.0 - 100.0 fL   MCH 28.7 26.0 - 34.0 pg   MCHC 33.3 30.0 - 36.0 g/dL   RDW 16.1 09.6 - 04.5 %   Platelets 198 150 - 400 K/uL   nRBC 0.0 0.0 - 0.2 %  UA  Result Value Ref Range   Color, Urine YELLOW YELLOW   APPearance CLEAR CLEAR   Specific Gravity, Urine 1.024 1.005 - 1.030   pH 7.0 5.0 - 8.0   Glucose, UA NEGATIVE NEGATIVE mg/dL   Hgb urine dipstick NEGATIVE NEGATIVE   Bilirubin Urine NEGATIVE NEGATIVE   Ketones, ur NEGATIVE NEGATIVE mg/dL   Protein, ur 30 (A) NEGATIVE mg/dL   Nitrite NEGATIVE NEGATIVE   Leukocytes,Ua NEGATIVE NEGATIVE   RBC / HPF 0-5 0 - 5 RBC/hpf   WBC, UA 0-5 0 - 5 WBC/hpf   Bacteria, UA NONE SEEN NONE SEEN   Mucus PRESENT   Brain natriuretic peptide  Result Value Ref Range   B Natriuretic Peptide 40.1 0.0 - 100.0 pg/mL  Comprehensive metabolic panel  Result Value Ref Range   Sodium 141 135 - 145 mmol/L   Potassium 3.8 3.5 - 5.1 mmol/L   Chloride 109 98 - 111 mmol/L   CO2 23 22 - 32 mmol/L   Glucose, Bld 101 (H) 70 - 99 mg/dL   BUN 15 6 - 20 mg/dL   Creatinine, Ser 4.09 0.61 - 1.24 mg/dL   Calcium 9.1 8.9 - 81.1 mg/dL   Total Protein 7.2 6.5 - 8.1 g/dL   Albumin 3.6 3.5 - 5.0 g/dL   AST 19 15 - 41 U/L   ALT 27 0 - 44 U/L   Alkaline Phosphatase 66 38 - 126 U/L   Total Bilirubin 0.8 0.3 - 1.2 mg/dL   GFR calc non Af Amer >60 >60 mL/min   GFR calc Af Amer >60 >60 mL/min   Anion gap 9 5 - 15  CBG monitoring, ED  Result Value Ref Range   Glucose-Capillary 94 70 - 99 mg/dL   Comment 1 Notify RN     Comment 2 Document in Chart   Troponin I (High Sensitivity)  Result Value Ref Range   Troponin I (High Sensitivity) 8 <18 ng/L   Xr Chest Pa/l  Result Date: 06/07/2019 CLINICAL DATA:  Chest pain, shortness of breath EXAM: CHEST - 2 VIEW COMPARISON:  None. FINDINGS: Cardiomegaly. Both lungs are clear. Disc degenerative disease of the thoracic spine. IMPRESSION: Cardiomegaly without acute abnormality of the lungs. Electronically Signed   By: Lauralyn Primes M.D.   On: 06/07/2019 10:56    EKG EKG Interpretation  Date/Time:  Friday June 07 2019 10:13:41 EST Ventricular Rate:  67 PR Interval:    QRS Duration: 124 QT Interval:  424 QTC Calculation: 448 R Axis:   -42 Text Interpretation: Sinus rhythm Prolonged PR interval Nonspecific IVCD with LAD Left ventricular hypertrophy Nonspecific ST abnormality No previous tracing Confirmed by Cathren Laine ([id number]) on 06/07/2019 10:17:19 AM   Radiology Xr Chest Pa/l  Result Date: 06/07/2019 CLINICAL DATA:  Chest pain, shortness of breath EXAM: CHEST - 2 VIEW COMPARISON:  None. FINDINGS: Cardiomegaly. Both lungs are clear. Disc degenerative disease of the thoracic spine. IMPRESSION: Cardiomegaly without acute abnormality of the lungs. Electronically Signed   By: Lauralyn Primes M.D.   On: 06/07/2019 10:56    Procedures Procedures (including critical care time)  Medications Ordered in ED Medications - No data to display   Initial Impression / Assessment and Plan / ED  Course  I have reviewed the triage vital signs and the nursing notes.  Pertinent labs & imaging results that were available during my care of the patient were reviewed by me and considered in my medical decision making (see chart for details).  Iv ns. Continuous pulse ox and monitor. Labs. Cxr.   Reviewed nursing notes and prior charts for additional history.   Labs reviewed/interpreted by me - wbc normal, hgb normal. bnp normal.  CXR reviewed/interpreted by me - no pna.   Labs indicates some labs hemolyzed - rn re-sent. Pt updated re delay.  Recheck pt, no cp, no in increased wob. sr on monitor.   Additional labs reviewed/interpreted by me - trop is normal.   Recheck pt, no chest pain or discomfort. No increased wob. Remains in sinus rhythm. No faintness or dizziness. Tolerating po. Ambulates w steady gait.  Pt currently appears stable for d/c.   With earlier symptoms, rec outpt cardiology f/u.  Return precautions provided.     Final Clinical Impressions(s) / ED Diagnoses   Final diagnoses:  None    ED Discharge Orders    None       Lajean Saver, MD 06/07/19 1326

## 2019-06-07 NOTE — ED Notes (Signed)
Patient Alert and oriented to baseline. Stable and ambulatory to baseline. Patient verbalized understanding of the discharge instructions.  Patient belongings were taken by the patient.   

## 2019-06-07 NOTE — Discharge Instructions (Addendum)
It was our pleasure to provide your ER care today - we hope that you feel better.  Rest. Drink plenty of fluids. Eat balanced diet.   Continue your blood pressure medication, limit salt intake, and follow up with your doctor for recheck of blood pressure in the coming week.   For earlier symptoms, follow up with cardiologist in 1 week - call office to arrange appointment.   Return to ER right away if worse, new symptoms, recurrent or persistent chest pain, increased trouble breathing, persistent fast heart beat, weak/fainting, high fevers, or other concern.

## 2019-06-07 NOTE — ED Triage Notes (Signed)
To ED via [facility] from home with c/o chest pain, left arm pain, shortness of breath and dizziness that started this am-- works at [employer] and has been under stress--

## 2019-06-11 ENCOUNTER — Ambulatory Visit (INDEPENDENT_AMBULATORY_CARE_PROVIDER_SITE_OTHER): Payer: Medicare Other | Admitting: Neurology

## 2019-06-11 DIAGNOSIS — G4733 Obstructive sleep apnea (adult) (pediatric): Secondary | ICD-10-CM | POA: Diagnosis not present

## 2019-06-11 DIAGNOSIS — Z6841 Body Mass Index (BMI) 40.0 and over, adult: Secondary | ICD-10-CM

## 2019-06-11 DIAGNOSIS — R002 Palpitations: Secondary | ICD-10-CM

## 2019-06-11 DIAGNOSIS — R351 Nocturia: Secondary | ICD-10-CM

## 2019-06-11 DIAGNOSIS — G4720 Circadian rhythm sleep disorder, unspecified type: Secondary | ICD-10-CM

## 2019-06-11 DIAGNOSIS — G4719 Other hypersomnia: Secondary | ICD-10-CM

## 2019-06-19 ENCOUNTER — Telehealth: Payer: Self-pay

## 2019-06-19 DIAGNOSIS — G4733 Obstructive sleep apnea (adult) (pediatric): Secondary | ICD-10-CM

## 2019-06-19 NOTE — Telephone Encounter (Signed)
-----   Message from Star Age, MD sent at 06/19/2019  8:31 AM EST ----- Patient referred by Dr. Virgina Jock, seen by me on 05/20/19 for re-eval of his prior Dx of OSA, diagnostic PSG on 06/11/19.    Please call and notify the patient that the recent sleep study did confirm the diagnosis of obstructive sleep apnea. OSA is overall mild, but worth treating to see if he feels better after treatment, such as his sleepiness and frequent bathroom breaks at night. To that end I recommend treatment for this in the form of autoPAP, which means, that we don't have to bring him back for a second sleep study with CPAP, but will let him try an autoPAP machine at home, through a DME company (of his choice, or as per insurance requirement). The DME representative will educate him on how to use the machine, how to put the mask on, etc.  I have not yet placed an order. Alternative Rx includes wt loss and avoiding the supine sleep position, or a dental device. He can talk to his dentist or, if he wishes, we can place a referral for evaluation of a dental device for OSA treatment. Let me know, how he would like to proceed.   Star Age, MD, PhD [facility])

## 2019-06-19 NOTE — Telephone Encounter (Signed)
I called pt to discuss his sleep study results. No answer, left a message asking him to call me back. 

## 2019-06-19 NOTE — Progress Notes (Signed)
Patient referred by Dr. Virgina Jock, seen by me on 05/20/19 for re-eval of his prior Dx of OSA, diagnostic PSG on 06/11/19.    Please call and notify the patient that the recent sleep study did confirm the diagnosis of obstructive sleep apnea. OSA is overall mild, but worth treating to see if he feels better after treatment, such as his sleepiness and frequent bathroom breaks at night. To that end I recommend treatment for this in the form of autoPAP, which means, that we don't have to bring him back for a second sleep study with CPAP, but will let him try an autoPAP machine at home, through a DME company (of his choice, or as per insurance requirement). The DME representative will educate him on how to use the machine, how to put the mask on, etc.  I have not yet placed an order. Alternative Rx includes wt loss and avoiding the supine sleep position, or a dental device. He can talk to his dentist or, if he wishes, we can place a referral for evaluation of a dental device for OSA treatment. Let me know, how he would like to proceed.   Star Age, MD, PhD [facility])

## 2019-06-19 NOTE — Procedures (Signed)
PATIENT'S NAME:  Jerry [name], Jerry [name]. DOB:      [birth year]      MRN:    [id number]     DATE OF RECORDING: 06/11/2019 REFERRING M.D.:  Vernell Leep, MD  Study Performed:   Baseline Polysomnogram HISTORY: 43 year old man with a history of hypertension, abnormal EKG, and morbid obesity with a BMI of over 40, who reports snoring and excessive daytime somnolence and a prior Dx of OSA. He no longer has a PAP machine. When he was on it in the past he did feel an improvement. The patient endorsed the Epworth Sleepiness Scale at 13/24. The patient's weight 288 pounds with a height of 67 (inches), resulting in a BMI of 45.3 kg/m2. The patient's neck circumference measured 18.2 inches.  CURRENT MEDICATIONS: Norvasc, Zyrtec, Vitamin D2, Apresoline, Cozaar, Prilosec, Aldactone, Flomax.   PROCEDURE:  This is a multichannel digital polysomnogram utilizing the Somnostar 11.2 system.  Electrodes and sensors were applied and monitored per AASM Specifications.   EEG, EOG, Chin and Limb EMG, were sampled at 200 Hz.  ECG, Snore and Nasal Pressure, Thermal Airflow, Respiratory Effort, CPAP Flow and Pressure, Oximetry was sampled at 50 Hz. Digital video and audio were recorded.      BASELINE STUDY: Lights Out was at 21:51 and Lights On at 04:49.  Total recording time (TRT) was 418 minutes, with a total sleep time (TST) of 302.5 minutes.   The patient's sleep latency was 41.5 minutes, which is delayed. REM latency was 132.5 minutes, which is delayed. The sleep efficiency was 72.4 %.     SLEEP ARCHITECTURE: WASO (Wake after sleep onset) was 65 minutes with mild to moderate sleep fragmentation noted. There were 10.5 minutes in Stage N1, 208 minutes Stage N2, 26.5 minutes Stage N3 and 57.5 minutes in Stage REM.  The percentage of Stage N1 was 3.5%, Stage N2 was 68.8%, which is increased, Stage N3 was 8.8%, and Stage R (REM sleep) was 19.%, which is normal.  RESPIRATORY ANALYSIS:  There were a total of 29 respiratory events:   8 obstructive apneas, 0 central apneas and 0 mixed apneas with a total of 8 apneas and an apnea index (AI) of 1.6 /hour. There were 21 hypopneas with a hypopnea index of 4.2 /hour. The patient also had 0 respiratory event related arousals (RERAs).      The total APNEA/HYPOPNEA INDEX (AHI) was 5.8 /hour and the total RESPIRATORY DISTURBANCE INDEX was 5.8 /hour.  12 events occurred in REM sleep and 20 events in NREM. The REM AHI was 12.5 /hour, versus a non-REM AHI of 4.2. The patient spent 46 minutes of total sleep time in the supine position and 257 minutes in non-supine.. The supine AHI was 10.4 /hour versus a non-supine AHI of 4.9/hour.  OXYGEN SATURATION & C02:  The Wake baseline 02 saturation was 96%, with the lowest being 88%. Time spent below 89% saturation equaled 0 minutes.  AROUSALS:  The arousals were noted as: 44 were spontaneous, 6 were associated with PLMs, 12 were associated with respiratory events.  PLMs: The patient had a total of 9 Periodic Limb Movements.  The Periodic Limb Movement (PLM) index was 1.8 /hour and the PLM Arousal index was 1.2/hour.  Audio and video analysis did not show any abnormal or unusual movements, complex behaviors, phonations or vocalizations. Nocturia, 9 times. Mild snoring was noted. The EKG in normal sinus rhythm (NSR).  IMPRESSION:  1. Mild obstructive Sleep Apnea (OSA) 2. Nocturia 3. Dysfunctions associated with sleep stages or  arousals from sleep   RECOMMENDATIONS:  1. This study demonstrates overall mild obstructive sleep apnea, with a total AHI of 5.8/hour, REM AHI of 12.5/hour, supine AHI of 10.4/h, and O2 nadir of 88%. The absence of supine REM sleep may underestimate his AHI and O2 nadir. Given the patient's medical history and sleep related complaints, and significant nocturia, treatment with positive airway pressure is recommended; this can be achieved in the form of autoPAP. Alternatively, a full-night CPAP titration study would allow  optimization of therapy if needed. Other treatment options may include avoidance of supine sleep position along with weight loss, upper airway or jaw surgery in selected patients or the use of an oral appliance in certain patients. ENT evaluation and/or consultation with a maxillofacial surgeon or dentist may be feasible in some instances. Please note that untreated obstructive sleep apnea may carry additional perioperative morbidity. Patients with significant obstructive sleep apnea should receive perioperative PAP therapy and the surgeons and particularly the anesthesiologist should be informed of the diagnosis and the severity of the sleep disordered breathing. 2. This study shows sleep fragmentation and abnormal sleep stage percentages; these are nonspecific findings and per se do not signify an intrinsic sleep disorder or a cause for the patient's sleep-related symptoms. Causes include (but are not limited to) the first night effect of the sleep study, circadian rhythm disturbances, medication effect or an underlying mood disorder or medical problem.  3. The patient should be cautioned not to drive, work at heights, or operate dangerous or heavy equipment when tired or sleepy. Review and reiteration of good sleep hygiene measures should be pursued with any patient. 4. The patient and his referring provider will be notified of the test results. The patient will be seen in sleep clinic if necessary.  I certify that I have reviewed the entire raw data recording prior to the issuance of this report in accordance with the Standards of Accreditation of the American Academy of Sleep Medicine (AASM)   Huston Foley, MD, PhD Diplomat, American Board of Neurology and Sleep Medicine (Neurology and Sleep Medicine)

## 2019-06-20 NOTE — Telephone Encounter (Signed)
I called pt, spoke to pt's wife, per DPR. I discussed pt's sleep study results and recommendations. Pt's wife reports that pt would like to try auto pap. I discussed this process and compliance requirements. A f/u appt was made for 09/18/19 at 3:30pm with Amy, NP. Pt's wife verbalized understanding of results and had no further questions.

## 2019-06-20 NOTE — Telephone Encounter (Signed)
autoPAP order placed. Thanks.

## 2019-06-20 NOTE — Telephone Encounter (Signed)
Patient wife returned missed call. Please follow up

## 2019-06-20 NOTE — Addendum Note (Signed)
Addended by: Star Age on: 06/20/2019 07:13 PM   Modules accepted: Orders

## 2019-06-24 NOTE — Telephone Encounter (Signed)
Order to start auto-pap was sent to [facility].

## 2019-06-25 NOTE — Telephone Encounter (Signed)
Received this notice from [facility]: Per Medicare history, pt got a CPAP on 11/26/15. Pt is not eligible for a new machine until 11/26/2020.Jerry [name]

## 2019-08-13 ENCOUNTER — Other Ambulatory Visit: Payer: Self-pay | Admitting: Cardiology

## 2019-08-13 DIAGNOSIS — R600 Localized edema: Secondary | ICD-10-CM

## 2019-08-13 DIAGNOSIS — I10 Essential (primary) hypertension: Secondary | ICD-10-CM

## 2019-09-12 ENCOUNTER — Ambulatory Visit: Payer: Medicare Other | Attending: Internal Medicine

## 2019-09-12 DIAGNOSIS — Z23 Encounter for immunization: Secondary | ICD-10-CM

## 2019-09-12 NOTE — Progress Notes (Signed)
   Covid-19 Vaccination Clinic  Name:  Jerry [name]    MRN: [id number] DOB: Apr 17, 1976  09/12/2019  [name] was observed post Covid-19 immunization for 15 minutes without incident. He was provided with Vaccine Information Sheet and instruction to access the V-Safe system.   [name] was instructed to call 911 with any severe reactions post vaccine: Marland Kitchen Difficulty breathing  . Swelling of face and throat  . A fast heartbeat  . A bad rash all over body  . Dizziness and weakness   Immunizations Administered    Name Date Dose VIS Date Route   Pfizer COVID-19 Vaccine 09/12/2019  4:49 PM 0.3 mL 06/21/2019 Intramuscular   Manufacturer: [employer], [employer]   Lot: [id number]   NDC: [address]-5

## 2019-09-18 ENCOUNTER — Ambulatory Visit: Payer: Self-pay | Admitting: Family Medicine

## 2019-10-04 ENCOUNTER — Ambulatory Visit
Admission: EM | Admit: 2019-10-04 | Discharge: 2019-10-04 | Disposition: A | Discharge status: Home / Self Care | Payer: Medicare Other

## 2019-10-04 DIAGNOSIS — M542 Cervicalgia: Secondary | ICD-10-CM

## 2019-10-04 DIAGNOSIS — M778 Other enthesopathies, not elsewhere classified: Secondary | ICD-10-CM

## 2019-10-04 NOTE — Discharge Instructions (Signed)
Recommend wearing a elbow sleeve for compression and comfort. You can take Aleve or ibuprofen as needed for pain Occasionally do ice to the elbow when the symptoms flareup. Your neck pain is most likely muscle strain or spasming You can do heat to the neck. Follow up as needed for continued or worsening symptoms

## 2019-10-04 NOTE — ED Provider Notes (Signed)
[facility]    CSN: [id number] Arrival date & time: 10/04/19  1044      History   Chief Complaint Chief Complaint  Patient presents with  . Neck Pain    HPI Jerry [name] is a 44 y.o. male.   Patient is a 44 year old male presents today with right-sided neck pain and right elbow pain for over a week now.  Symptoms been constant, waxing waning.  Reporting he does a lot of heavy lifting and repetitive movements with the arm at work.  Denies any numbness, tingling or weakness in the right arm.  Denies any specific injuries.  He has not taken anything to treat his symptoms.  ROS per HPI      Past Medical History:  Diagnosis Date  . Hypertension     Patient Active Problem List   Diagnosis Date Noted  . Leg edema 05/24/2019  . Diabetes mellitus ([place]) 04/19/2019  . Gastroesophageal reflux disease without esophagitis 08/12/2018  . Obesity, morbid, BMI 50 or higher ([place]) 09/15/2014  . Essential hypertension 10/20/2008    Past Surgical History:  Procedure Laterality Date  . ACHILLES TENDON SURGERY    . TOE SURGERY         Home Medications    Prior to Admission medications   Medication Sig Start Date End Date Taking? Authorizing Provider  amLODipine (NORVASC) 5 MG tablet Take 5 mg by mouth daily.    [provider]  hydrALAZINE (APRESOLINE) 100 MG tablet Take 100 mg by mouth 2 (two) times daily.     [provider]  losartan (COZAAR) 100 MG tablet Take 100 mg by mouth daily.    [provider]  spironolactone (ALDACTONE) 50 MG tablet TAKE 1 TABLET(50 MG) BY MOUTH DAILY 08/13/19   Patwardhan, Reynold Bowen, MD    Family History Family History  Problem Relation Age of Onset  . Heart attack Maternal Grandmother   . Heart attack Paternal Grandmother     Social History Social History   Tobacco Use  . Smoking status: Former Smoker    Years: 1.00    Types: Cigarettes  . Smokeless tobacco: Never Used  . Tobacco comment: few  a day   Substance Use Topics  . Alcohol use: Never  . Drug use: Never     Allergies   Patient has no known allergies.   Review of Systems Review of Systems   Physical Exam Triage Vital Signs ED Triage Vitals [10/04/19 1105]  Enc Vitals Group     BP 136/86     Pulse Rate 73     Resp 16     Temp 98 F (36.7 C)     Temp Source Oral     SpO2 98 %     Weight      Height      Head Circumference      Peak Flow      Pain Score 7     Pain Loc      Pain Edu?      Excl. in [place]?    No data found.  Updated Vital Signs BP 136/86 (BP Location: Left Arm)   Pulse 73   Temp 98 F (36.7 C) (Oral)   Resp 16   SpO2 98%   Visual Acuity Right Eye Distance:   Left Eye Distance:   Bilateral Distance:    Right Eye Near:   Left Eye Near:    Bilateral Near:     Physical Exam Neck:  Musculoskeletal:     Right elbow: No swelling, deformity, effusion or lacerations. Decreased range of motion. Tenderness present.     Cervical back: Normal range of motion. No edema, signs of trauma, rigidity, torticollis or crepitus. Muscular tenderness present. No pain with movement or spinous process tenderness. Normal range of motion.      UC Treatments / Results  Labs (all labs ordered are listed, but only abnormal results are displayed) Labs Reviewed - No data to display  EKG   Radiology No results found.  Procedures Procedures (including critical care time)  Medications Ordered in UC Medications - No data to display  Initial Impression / Assessment and Plan / UC Course  I have reviewed the triage vital signs and the nursing notes.  Pertinent labs & imaging results that were available during my care of the patient were reviewed by me and considered in my medical decision making (see chart for details).     Elbow tendinitis-recommend proper elbow sleeve Rest, ice, elevate Aleve or ibuprofen as needed for pain  Neck pain Musculoskeletal in nature. Recommended heat and  gentle stretching Follow up as needed for continued or worsening symptoms  Final Clinical Impressions(s) / UC Diagnoses   Final diagnoses:  Elbow tendinitis  Neck pain on right side     Discharge Instructions     Recommend wearing a elbow sleeve for compression and comfort. You can take Aleve or ibuprofen as needed for pain Occasionally do ice to the elbow when the symptoms flareup. Your neck pain is most likely muscle strain or spasming You can do heat to the neck. Follow up as needed for continued or worsening symptoms     ED Prescriptions    None     PDMP not reviewed this encounter.   Dahlia Byes A, NP 10/06/19 1455

## 2019-10-04 NOTE — ED Triage Notes (Signed)
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 Pt c/o rt sided neck pain and rt elbow pain for over a week. Denies ininjury.

## 2019-10-09 ENCOUNTER — Ambulatory Visit: Payer: Medicare Other | Attending: Internal Medicine

## 2019-10-09 DIAGNOSIS — Z23 Encounter for immunization: Secondary | ICD-10-CM

## 2019-10-09 NOTE — Progress Notes (Signed)
   Covid-19 Vaccination Clinic  Name:  Jerry [name]    MRN: [id number] DOB: 03-26-1976  10/09/2019  Mr. Dai was observed post Covid-19 immunization for 15 minutes without incident. He was provided with Vaccine Information Sheet and instruction to access the V-Safe system.   Mr. Dunklee was instructed to call 911 with any severe reactions post vaccine: Marland Kitchen Difficulty breathing  . Swelling of face and throat  . A fast heartbeat  . A bad rash all over body  . Dizziness and weakness   Immunizations Administered    Name Date Dose VIS Date Route   Pfizer COVID-19 Vaccine 10/09/2019  3:36 PM 0.3 mL 06/21/2019 Intramuscular   Manufacturer: [employer], [employer]   Lot: [id number]   NDC: [address]-5

## 2019-11-19 ENCOUNTER — Ambulatory Visit
Admission: EM | Admit: 2019-11-19 | Discharge: 2019-11-19 | Disposition: A | Discharge status: Home / Self Care | Payer: Medicare Other | Attending: Physician Assistant | Admitting: Physician Assistant

## 2019-11-19 DIAGNOSIS — K0510 Chronic gingivitis, plaque induced: Secondary | ICD-10-CM

## 2019-11-19 MED ORDER — CHLORHEXIDINE GLUCONATE 0.12 % MT SOLN: 15.0000 mL | Freq: Two times a day (BID) | OROMUCOSAL | 0 refills | Status: DC

## 2019-11-19 MED ORDER — AMOXICILLIN-POT CLAVULANATE 875-125 MG PO TABS: 1.0000 | ORAL_TABLET | Freq: Two times a day (BID) | ORAL | 0 refills | Status: DC

## 2019-11-19 MED ORDER — DICLOFENAC SODIUM 50 MG PO TBEC: 50.0000 mg | DELAYED_RELEASE_TABLET | Freq: Two times a day (BID) | ORAL | 0 refills | Status: DC

## 2019-11-19 NOTE — ED Provider Notes (Signed)
[facility]    CSN: [id number] Arrival date & time: 11/19/19  1724      History   Chief Complaint Chief Complaint  Patient presents with  . Dental Pain    HPI Jerry [name] is a 44 y.o. male.   44 year old male comes in for few day history of bilateral upper teeth pain.  Denies injury/trauma to the teeth.  States symptoms started after eating pecan pie.  Denies swelling of the throat, tripoding, drooling, trismus.  Denies fever, chills, body aches.  Has not taken anything for the symptoms.     Past Medical History:  Diagnosis Date  . Hypertension     Patient Active Problem List   Diagnosis Date Noted  . Leg edema 05/24/2019  . Diabetes mellitus ([facility]) 04/19/2019  . Gastroesophageal reflux disease without esophagitis 08/12/2018  . Obesity, morbid, BMI 50 or higher ([facility]) 09/15/2014  . Essential hypertension 10/20/2008    Past Surgical History:  Procedure Laterality Date  . ACHILLES TENDON SURGERY    . TOE SURGERY         Home Medications    Prior to Admission medications   Medication Sig Start Date End Date Taking? Authorizing Provider  amLODipine (NORVASC) 5 MG tablet Take 5 mg by mouth daily.    [provider]  amoxicillin-clavulanate (AUGMENTIN) 875-125 MG tablet Take 1 tablet by mouth every 12 (twelve) hours. 11/19/19   Cathie Hoops, Emerly Prak V, PA-C  chlorhexidine (PERIDEX) 0.12 % solution Use as directed 15 mLs in the mouth or throat 2 (two) times daily. 11/19/19   Cathie Hoops, Joffrey Kerce V, PA-C  diclofenac (VOLTAREN) 50 MG EC tablet Take 1 tablet (50 mg total) by mouth 2 (two) times daily. 11/19/19   Cathie Hoops, Jaesean Litzau V, PA-C  hydrALAZINE (APRESOLINE) 100 MG tablet Take 100 mg by mouth 2 (two) times daily.     [provider]  losartan (COZAAR) 100 MG tablet Take 100 mg by mouth daily.    [provider]  spironolactone (ALDACTONE) 50 MG tablet TAKE 1 TABLET(50 MG) BY MOUTH DAILY 08/13/19   Patwardhan, Anabel Bene, MD    Family History Family History   Problem Relation Age of Onset  . Heart attack Maternal Grandmother   . Heart attack Paternal Grandmother     Social History Social History   Tobacco Use  . Smoking status: Former Smoker    Years: 1.00    Types: Cigarettes  . Smokeless tobacco: Never Used  . Tobacco comment: few a day   Substance Use Topics  . Alcohol use: Never  . Drug use: Never     Allergies   Patient has no known allergies.   Review of Systems Review of Systems  Reason unable to perform ROS: See HPI as above.     Physical Exam Triage Vital Signs ED Triage Vitals  Enc Vitals Group     BP 11/19/19 1828 (!) 156/87     Pulse Rate 11/19/19 1828 64     Resp 11/19/19 1828 18     Temp 11/19/19 1828 97.8 F (36.6 C)     Temp Source 11/19/19 1828 Oral     SpO2 11/19/19 1828 98 %     Weight --      Height --      Head Circumference --      Peak Flow --      Pain Score 11/19/19 1829 10     Pain Loc --      Pain Edu? --  Excl. in [place]? --    No data found.  Updated Vital Signs BP (!) 156/87 (BP Location: Left Arm)   Pulse 64   Temp 97.8 F (36.6 C) (Oral)   Resp 18   SpO2 98%   Visual Acuity Right Eye Distance:   Left Eye Distance:   Bilateral Distance:    Right Eye Near:   Left Eye Near:    Bilateral Near:     Physical Exam Constitutional:      General: He is not in acute distress.    Appearance: He is well-developed. He is not ill-appearing, toxic-appearing or diaphoretic.  HENT:     Head: Normocephalic and atraumatic.     Jaw: No trismus.     Mouth/Throat:     Mouth: Mucous membranes are moist.     Pharynx: Oropharynx is clear. Uvula midline. No uvula swelling.     Tonsils: No tonsillar exudate.     Comments: Diffuse gingival swelling to the upper jaw, lower jaw with plaques seen. Tenderness to palpation along upper jaw. No fluctuance felt.  Floor of mouth soft to palpation. No facial swelling.  Musculoskeletal:     Cervical back: Normal range of motion and neck supple.   Skin:    General: Skin is warm and dry.  Neurological:     Mental Status: He is alert and oriented to person, place, and time.      UC Treatments / Results  Labs (all labs ordered are listed, but only abnormal results are displayed) Labs Reviewed - No data to display  EKG   Radiology No results found.  Procedures Procedures (including critical care time)  Medications Ordered in UC Medications - No data to display  Initial Impression / Assessment and Plan / UC Course  I have reviewed the triage vital signs and the nursing notes.  Pertinent labs & imaging results that were available during my care of the patient were reviewed by me and considered in my medical decision making (see chart for details).    Start antibiotics for possible dental infection. Symptomatic treatment as needed. Discussed with patient symptoms can return if dental problem is not addressed. Follow up with dentist for further evaluation and treatment of dental pain. Resources given. Return precautions given.   Final Clinical Impressions(s) / UC Diagnoses   Final diagnoses:  Gingivitis   ED Prescriptions    Medication Sig Dispense Auth. Provider   amoxicillin-clavulanate (AUGMENTIN) 875-125 MG tablet Take 1 tablet by mouth every 12 (twelve) hours. 14 tablet Tekila Caillouet V, PA-C   chlorhexidine (PERIDEX) 0.12 % solution Use as directed 15 mLs in the mouth or throat 2 (two) times daily. 473 mL Zalaya Astarita V, PA-C   diclofenac (VOLTAREN) 50 MG EC tablet Take 1 tablet (50 mg total) by mouth 2 (two) times daily. 20 tablet Ok Edwards, PA-C     I have reviewed the PDMP during this encounter.   Ok Edwards, PA-C 11/19/19 1855

## 2019-11-19 NOTE — Discharge Instructions (Signed)
Start Augmentin as directed for dental infection. Peridex for dental hygiene. Start diclofenac as directed. I have attached information for gum infection in the back, you can read for more information. Follow up with dentist for further treatment and evaluation. If experiencing swelling of the throat, trouble breathing, trouble swallowing, leaning forward to breath, drooling, go to the emergency department for further evaluation.

## 2019-11-19 NOTE — ED Triage Notes (Signed)
Pt c/o dental pain to bilateral upper teeth for past couple days. Pt c/o chronic neck, back and ankle pain from playing footballs years ago. Denies injury

## 2019-12-11 ENCOUNTER — Other Ambulatory Visit: Payer: Self-pay | Admitting: Cardiology

## 2019-12-11 DIAGNOSIS — R600 Localized edema: Secondary | ICD-10-CM

## 2019-12-11 DIAGNOSIS — I10 Essential (primary) hypertension: Secondary | ICD-10-CM

## 2020-03-07 ENCOUNTER — Encounter: Payer: Self-pay | Admitting: Emergency Medicine

## 2020-03-07 ENCOUNTER — Other Ambulatory Visit: Payer: Self-pay

## 2020-03-07 ENCOUNTER — Ambulatory Visit
Admission: EM | Admit: 2020-03-07 | Discharge: 2020-03-07 | Disposition: A | Discharge status: Home / Self Care | Payer: Medicare Other | Attending: Emergency Medicine | Admitting: Emergency Medicine

## 2020-03-07 DIAGNOSIS — M5441 Lumbago with sciatica, right side: Secondary | ICD-10-CM

## 2020-03-07 HISTORY — DX: Other chronic pain: G89.29

## 2020-03-07 MED ORDER — TIZANIDINE HCL 4 MG PO TABS: 4.0000 mg | ORAL_TABLET | Freq: Four times a day (QID) | ORAL | 0 refills | Status: AC | PRN

## 2020-03-07 MED ORDER — DICLOFENAC SODIUM 50 MG PO TBEC: 50.0000 mg | DELAYED_RELEASE_TABLET | Freq: Two times a day (BID) | ORAL | 0 refills | Status: DC

## 2020-03-07 MED ORDER — METHYLPREDNISOLONE SODIUM SUCC 125 MG IJ SOLR
125.0000 mg | Freq: Once | INTRAMUSCULAR | Status: AC
  Administered 2020-03-07: 125 mg via INTRAMUSCULAR

## 2020-03-07 MED ORDER — NAPROXEN 500 MG PO TABS: 500.0000 mg | ORAL_TABLET | Freq: Two times a day (BID) | ORAL | 0 refills | Status: DC

## 2020-03-07 NOTE — ED Provider Notes (Signed)
[facility]    CSN: [id number] Arrival date & time: 03/07/20  1144      History   Chief Complaint Chief Complaint  Patient presents with  . Hip Pain  . Groin Pain    HPI Jerry [name] is a 44 y.o. male with history of obesity, hypertension, known bilateral low back pain with sciatica presenting for right-sided sciatic flare.  States it will radiate to his right groin at times.  No penile or testicular pain, swelling, discharge, urinary symptoms, history of renal calculi.  Denies fever, injury/inciting event.  Notes he is seen special since last evaluation by me earlier this year.  Has run out of his diclofenac: Requesting refill.  No acute saddle area anesthesia, lower extremity numbness or weakness, urinary retention or fecal incontinence.    Past Medical History:  Diagnosis Date  . Chronic back pain   . Hypertension     Patient Active Problem List   Diagnosis Date Noted  . Leg edema 05/24/2019  . Diabetes mellitus ([facility]) 04/19/2019  . Gastroesophageal reflux disease without esophagitis 08/12/2018  . Obesity, morbid, BMI 50 or higher ([facility]) 09/15/2014  . Essential hypertension 10/20/2008    Past Surgical History:  Procedure Laterality Date  . ACHILLES TENDON SURGERY    . TOE SURGERY         Home Medications    Prior to Admission medications   Medication Sig Start Date End Date Taking? Authorizing Provider  amLODipine (NORVASC) 5 MG tablet Take 5 mg by mouth daily.    [provider]  chlorhexidine (PERIDEX) 0.12 % solution Use as directed 15 mLs in the mouth or throat 2 (two) times daily. 11/19/19   Cathie Hoops, Amy V, PA-C  diclofenac (VOLTAREN) 50 MG EC tablet Take 1 tablet (50 mg total) by mouth 2 (two) times daily. 03/07/20   Hall-Potvin, [place], PA-C  hydrALAZINE (APRESOLINE) 100 MG tablet Take 100 mg by mouth 2 (two) times daily.     [provider]  losartan (COZAAR) 100 MG tablet Take 100 mg by mouth daily.    [provider]  naproxen (NAPROSYN) 500 MG tablet Take 1 tablet (500 mg total) by mouth 2 (two) times daily. 03/07/20   Hall-Potvin, [place], PA-C  spironolactone (ALDACTONE) 50 MG tablet TAKE 1 TABLET(50 MG) BY MOUTH DAILY 12/11/19   Patwardhan, Manish J, MD  tiZANidine (ZANAFLEX) 4 MG tablet Take 1 tablet (4 mg total) by mouth every 6 (six) hours as needed for up to 5 days for muscle spasms. 03/07/20 03/12/20  Hall-Potvin, [place], PA-C    Family History Family History  Problem Relation Age of Onset  . Heart attack Maternal Grandmother   . Heart attack Paternal Grandmother     Social History Social History   Tobacco Use  . Smoking status: Former Smoker    Years: 1.00    Types: Cigarettes  . Smokeless tobacco: Never Used  . Tobacco comment: few a day   Vaping Use  . Vaping Use: Never used  Substance Use Topics  . Alcohol use: Never  . Drug use: Never     Allergies   Gabapentin   Review of Systems As per HPI   Physical Exam Triage Vital Signs ED Triage Vitals  Enc Vitals Group     BP 03/07/20 1243 (!) 144/89     Pulse Rate 03/07/20 1243 90     Resp 03/07/20 1243 18     Temp 03/07/20 1243 (!) 97.5 F (36.4 C)  Temp Source 03/07/20 1243 Oral     SpO2 03/07/20 1243 98 %     Weight --      Height --      Head Circumference --      Peak Flow --      Pain Score 03/07/20 1244 8     Pain Loc --      Pain Edu? --      Excl. in [place]? --    No data found.  Updated Vital Signs BP (!) 144/89 (BP Location: Left Arm)   Pulse 90   Temp (!) 97.5 F (36.4 C) (Oral)   Resp 18   SpO2 98%   Visual Acuity Right Eye Distance:   Left Eye Distance:   Bilateral Distance:    Right Eye Near:   Left Eye Near:    Bilateral Near:     Physical Exam Constitutional:      General: He is not in acute distress. HENT:     Head: Normocephalic and atraumatic.  Eyes:     General: No scleral icterus.    Pupils: Pupils are equal, round, and reactive to light.  Cardiovascular:       Rate and Rhythm: Normal rate.  Pulmonary:     Effort: Pulmonary effort is normal. No respiratory distress.     Breath sounds: No wheezing.  Musculoskeletal:     Comments: Right lumbar tenderness that spares PSIS, spinous process.  No fluctuance or deformity, mass.  No rash.  Neurovascularly intact.  Positive SLR right.  Skin:    Coloration: Skin is not jaundiced or pale.  Neurological:     Mental Status: He is alert and oriented to person, place, and time.      UC Treatments / Results  Labs (all labs ordered are listed, but only abnormal results are displayed) Labs Reviewed - No data to display  EKG   Radiology No results found.  Procedures Procedures (including critical care time)  Medications Ordered in UC Medications  methylPREDNISolone sodium succinate (SOLU-MEDROL) 125 mg/2 mL injection 125 mg (125 mg Intramuscular Given 03/07/20 1337)    Initial Impression / Assessment and Plan / UC Course  I have reviewed the triage vital signs and the nursing notes.  Pertinent labs & imaging results that were available during my care of the patient were reviewed by me and considered in my medical decision making (see chart for details).     H&P consistent with right low back pain with right-sided sciatica.  Will treat supportively as outlined below.  Patient requesting Solu-Medrol: Given.  Encourage patient to follow-up with specialist for further evaluation/management.  Return precautions discussed, pt verbalized understanding and is agreeable to plan. Final Clinical Impressions(s) / UC Diagnoses   Final diagnoses:  Acute right-sided low back pain with right-sided sciatica     Discharge Instructions     RICE: rest, ice, compression, elevation as needed for pain.    Heat therapy (hot compress, warm wash rag, hot showers, etc.) can help relax muscles and soothe muscle aches. Cold therapy (ice packs) can be used to help swelling both after injury and after prolonged use  of areas of chronic pain/aches.  Pain medication: Diclofenac ONLY, may do naproxen when all done with diclofenac  May take muscle relaxer as needed for severe pain / spasm.  (This medication may cause you to become tired so it is important you do not drink alcohol or operate heavy machinery while on this medication.  Recommend your first dose  to be taken before bedtime to monitor for side effects safely)  Important to follow up with specialist(s) below for further evaluation/management if your symptoms persist or worsen.    ED Prescriptions    Medication Sig Dispense Auth. Provider   diclofenac (VOLTAREN) 50 MG EC tablet Take 1 tablet (50 mg total) by mouth 2 (two) times daily. 20 tablet Hall-Potvin, [place], PA-C   naproxen (NAPROSYN) 500 MG tablet Take 1 tablet (500 mg total) by mouth 2 (two) times daily. 30 tablet Hall-Potvin, [place], PA-C   tiZANidine (ZANAFLEX) 4 MG tablet Take 1 tablet (4 mg total) by mouth every 6 (six) hours as needed for up to 5 days for muscle spasms. 20 tablet Hall-Potvin, [place], PA-C     I have reviewed the PDMP during this encounter.   Hall-Potvin, [place], [place] 03/07/20 1338

## 2020-03-07 NOTE — Discharge Instructions (Addendum)
RICE: rest, ice, compression, elevation as needed for pain.    Heat therapy (hot compress, warm wash rag, hot showers, etc.) can help relax muscles and soothe muscle aches. Cold therapy (ice packs) can be used to help swelling both after injury and after prolonged use of areas of chronic pain/aches.  Pain medication: Diclofenac ONLY, may do naproxen when all done with diclofenac  May take muscle relaxer as needed for severe pain / spasm.  (This medication may cause you to become tired so it is important you do not drink alcohol or operate heavy machinery while on this medication.  Recommend your first dose to be taken before bedtime to monitor for side effects safely)  Important to follow up with specialist(s) below for further evaluation/management if your symptoms persist or worsen.

## 2020-03-07 NOTE — ED Triage Notes (Signed)
Pt here for right sided hip and groin pain that is chronic in nature worse over last week

## 2020-03-30 ENCOUNTER — Encounter: Payer: Self-pay | Admitting: Emergency Medicine

## 2020-03-30 ENCOUNTER — Ambulatory Visit
Admission: EM | Admit: 2020-03-30 | Discharge: 2020-03-30 | Disposition: A | Discharge status: Home / Self Care | Payer: Medicare Other | Attending: Urgent Care | Admitting: Urgent Care

## 2020-03-30 ENCOUNTER — Other Ambulatory Visit: Payer: Self-pay

## 2020-03-30 DIAGNOSIS — M48061 Spinal stenosis, lumbar region without neurogenic claudication: Secondary | ICD-10-CM

## 2020-03-30 DIAGNOSIS — M5126 Other intervertebral disc displacement, lumbar region: Secondary | ICD-10-CM

## 2020-03-30 MED ORDER — PREDNISONE 20 MG PO TABS: ORAL_TABLET | ORAL | 0 refills | Status: DC

## 2020-03-30 MED ORDER — KETOROLAC TROMETHAMINE 60 MG/2ML IM SOLN
60.0000 mg | Freq: Once | INTRAMUSCULAR | Status: AC
  Administered 2020-03-30: 60 mg via INTRAMUSCULAR

## 2020-03-30 NOTE — ED Triage Notes (Signed)
Pt sts right sided lower back pain with radiation to right leg with hx of similar worse x 3 days

## 2020-03-30 NOTE — Discharge Instructions (Addendum)
You have right-sided disc protrusion L4-L5 with moderate spinal stenosis and  severe subarticular stenosis on the right. You also have severe subarticular and foraminal stenosis on the left at L5-S1 due to spurring and left-sided disc protrusion. This was seen on your MRI of lumbar region from 01/2019. You need to see a back surgeon to help you. Make an appointment as soon possible.

## 2020-03-30 NOTE — ED Provider Notes (Signed)
[facility]   MRN: [id number] DOB: 19-May-1976  Subjective:   Jerry [name] is a 44 y.o. male presenting for 3-day history of acute on chronic lower back pain, right-sided that radiates into his right leg.  Patient was found to have a protruding disc in the lumbar region, spinal stenosis of the same region.  This was found on MRI in July 2020.  He has not seen a specialist.  Would like a recommendation for this.  His blood sugars have been in the low 100s.  Denies falls, trauma.  No current facility-administered medications for this encounter.  Current Outpatient Medications:  .  amLODipine (NORVASC) 5 MG tablet, Take 5 mg by mouth daily., Disp: , Rfl:  .  chlorhexidine (PERIDEX) 0.12 % solution, Use as directed 15 mLs in the mouth or throat 2 (two) times daily., Disp: 473 mL, Rfl: 0 .  diclofenac (VOLTAREN) 50 MG EC tablet, Take 1 tablet (50 mg total) by mouth 2 (two) times daily., Disp: 20 tablet, Rfl: 0 .  hydrALAZINE (APRESOLINE) 100 MG tablet, Take 100 mg by mouth 2 (two) times daily. , Disp: , Rfl:  .  losartan (COZAAR) 100 MG tablet, Take 100 mg by mouth daily., Disp: , Rfl:  .  naproxen (NAPROSYN) 500 MG tablet, Take 1 tablet (500 mg total) by mouth 2 (two) times daily., Disp: 30 tablet, Rfl: 0 .  spironolactone (ALDACTONE) 50 MG tablet, TAKE 1 TABLET(50 MG) BY MOUTH DAILY, Disp: 90 tablet, Rfl: 3   Allergies  Allergen Reactions  . Gabapentin     Past Medical History:  Diagnosis Date  . Chronic back pain   . Hypertension      Past Surgical History:  Procedure Laterality Date  . ACHILLES TENDON SURGERY    . TOE SURGERY      Family History  Problem Relation Age of Onset  . Heart attack Maternal Grandmother   . Heart attack Paternal Grandmother     Social History   Tobacco Use  . Smoking status: Former Smoker    Years: 1.00    Types: Cigarettes  . Smokeless tobacco: Never Used  . Tobacco comment: few a day   Vaping Use  . Vaping Use:  Never used  Substance Use Topics  . Alcohol use: Never  . Drug use: Never    ROS   Objective:   Vitals: BP (!) 151/83 (BP Location: Left Arm)   Pulse 74   Temp 98.5 F (36.9 C) (Oral)   Resp 18   SpO2 98%   Physical Exam Constitutional:      General: He is not in acute distress.    Appearance: Normal appearance. He is well-developed and normal weight. He is not ill-appearing, toxic-appearing or diaphoretic.  HENT:     Head: Normocephalic and atraumatic.     Right Ear: External ear normal.     Left Ear: External ear normal.     Nose: Nose normal.     Mouth/Throat:     Pharynx: Oropharynx is clear.  Eyes:     General: No scleral icterus.       Right eye: No discharge.        Left eye: No discharge.     Extraocular Movements: Extraocular movements intact.     Pupils: Pupils are equal, round, and reactive to light.  Cardiovascular:     Rate and Rhythm: Normal rate.  Pulmonary:     Effort: Pulmonary effort is normal.  Musculoskeletal:  Cervical back: Normal range of motion.     Lumbar back: Spasms and tenderness present. No swelling, edema, deformity, signs of trauma, lacerations or bony tenderness. Decreased range of motion. Positive right straight leg raise test. Negative left straight leg raise test. No scoliosis.  Neurological:     Mental Status: He is alert and oriented to person, place, and time.     Motor: No weakness.     Coordination: Coordination normal.     Gait: Gait normal.     Deep Tendon Reflexes: Reflexes normal.  Psychiatric:        Mood and Affect: Mood normal.        Behavior: Behavior normal.        Thought Content: Thought content normal.        Judgment: Judgment normal.      Assessment and Plan :   PDMP not reviewed this encounter.  1. Protruded lumbar disc   2. Spinal stenosis of lumbar region, unspecified whether neurogenic claudication present     IM Toradol in clinic, start steroid course tomorrow.  Recommended follow-up as  soon as possible with a spinal surgeon. Counseled patient on potential for adverse effects with medications prescribed/recommended today, ER and return-to-clinic precautions discussed, patient verbalized understanding.    Wallis Bamberg, [place] 03/30/20 1943

## 2020-05-24 ENCOUNTER — Encounter: Payer: Self-pay | Admitting: Emergency Medicine

## 2020-05-24 ENCOUNTER — Ambulatory Visit
Admission: EM | Admit: 2020-05-24 | Discharge: 2020-05-24 | Disposition: A | Discharge status: Home / Self Care | Payer: Medicare Other | Attending: Emergency Medicine | Admitting: Emergency Medicine

## 2020-05-24 ENCOUNTER — Telehealth: Payer: Self-pay | Admitting: Emergency Medicine

## 2020-05-24 ENCOUNTER — Other Ambulatory Visit: Payer: Self-pay

## 2020-05-24 DIAGNOSIS — H60391 Other infective otitis externa, right ear: Secondary | ICD-10-CM

## 2020-05-24 MED ORDER — NEOMYCIN-POLYMYXIN-HC 3.5-10000-1 OT SOLN: 3.0000 [drp] | Freq: Four times a day (QID) | OTIC | 0 refills | Status: DC

## 2020-05-24 MED ORDER — METHYLPREDNISOLONE SODIUM SUCC 125 MG IJ SOLR
80.0000 mg | Freq: Once | INTRAMUSCULAR | Status: AC
  Administered 2020-05-24: 80 mg via INTRAMUSCULAR

## 2020-05-24 NOTE — Discharge Instructions (Addendum)
Use eardrops as prescribed for the next week. Return for worsening ear pain, swelling, discharge, bleeding, decreased hearing, development of jaw pain/swelling, fever.  Do NOT use Q-tips as these can cause your ear wax to get stuck, the tips may break off and become a foreign body requiring additional medical care, or puncture your eardrum.  Helpful prevention tip: Use a solution of equal parts isopropyl (rubbing) alcohol and white vinegar (acetic acid) in both ears after swimming. 

## 2020-05-24 NOTE — ED Triage Notes (Signed)
Pt here for right sided ear pain x 2 days

## 2020-05-24 NOTE — ED Provider Notes (Signed)
Jerry [name]    CSN: [id number] Arrival date & time: 05/24/20  4098      History   Chief Complaint Chief Complaint  Patient presents with  . Otalgia    HPI Jerry [name] is a 44 y.o. male  Resenting for right ear pain x2 days.  Denies trauma, travel, discharge, fever. Patient also requesting information from previous visit about sciatica.  Has appointment pending for further evaluation and management.  Stable in that regard.  Past Medical History:  Diagnosis Date  . Chronic back pain   . Hypertension     Patient Active Problem List   Diagnosis Date Noted  . Leg edema 05/24/2019  . Diabetes mellitus (HCC) 04/19/2019  . Gastroesophageal reflux disease without esophagitis 08/12/2018  . Obesity, morbid, BMI 50 or higher (HCC) 09/15/2014  . Essential hypertension 10/20/2008    Past Surgical History:  Procedure Laterality Date  . ACHILLES TENDON SURGERY    . TOE SURGERY         Home Medications    Prior to Admission medications   Medication Sig Start Date End Date Taking? Authorizing Provider  amLODipine (NORVASC) 5 MG tablet Take 5 mg by mouth daily.    [provider]  hydrALAZINE (APRESOLINE) 100 MG tablet Take 100 mg by mouth 2 (two) times daily.     [provider]  losartan (COZAAR) 100 MG tablet Take 100 mg by mouth daily.    [provider]  naproxen (NAPROSYN) 500 MG tablet Take 1 tablet (500 mg total) by mouth 2 (two) times daily. 03/07/20   Jerry-Potvin, [place], PA-C  neomycin-polymyxin-hydrocortisone (CORTISPORIN) OTIC solution Place 3 drops into the right ear 4 (four) times daily. 05/24/20   Jerry-Potvin, [place], PA-C  spironolactone (ALDACTONE) 50 MG tablet TAKE 1 TABLET(50 MG) BY MOUTH DAILY 12/11/19   Patwardhan, Anabel Bene, MD    Family History Family History  Problem Relation Age of Onset  . Heart attack Maternal Grandmother   . Heart attack Paternal Grandmother     Social History Social History    Tobacco Use  . Smoking status: Former Smoker    Years: 1.00    Types: Cigarettes  . Smokeless tobacco: Never Used  . Tobacco comment: few a day   Vaping Use  . Vaping Use: Never used  Substance Use Topics  . Alcohol use: Never  . Drug use: Never     Allergies   Gabapentin   Review of Systems Review of Systems  Constitutional: Negative for fatigue and fever.  HENT: Positive for ear pain. Negative for ear discharge and facial swelling.   Respiratory: Negative for cough and shortness of breath.   Cardiovascular: Negative for chest pain and palpitations.  Gastrointestinal: Negative for abdominal pain, diarrhea and vomiting.  Musculoskeletal: Negative for arthralgias and myalgias.  Skin: Negative for rash and wound.  Neurological: Negative for speech difficulty and headaches.  All other systems reviewed and are negative.    Physical Exam Triage Vital Signs ED Triage Vitals  Enc Vitals Group     BP      Pulse      Resp      Temp      Temp src      SpO2      Weight      Height      Head Circumference      Peak Flow      Pain Score      Pain Loc  Pain Edu?      Excl. in GC?    No data found.  Updated Vital Signs BP (!) 167/79 (BP Location: Left Arm)   Pulse 84   Temp 98.2 F (36.8 C) (Oral)   Resp 18   SpO2 97%   Visual Acuity Right Eye Distance:   Left Eye Distance:   Bilateral Distance:    Right Eye Near:   Left Eye Near:    Bilateral Near:     Physical Exam Constitutional:      General: He is not in acute distress.    Appearance: He is obese. He is not ill-appearing.  HENT:     Head: Normocephalic and atraumatic.     Right Ear: Tympanic membrane normal.     Left Ear: Tympanic membrane, ear canal and external ear normal.     Ears:     Comments: Right ear with tragal tenderness, EAC swelling and erythema.  No discharge, TM perforation or foreign body.    Nose: No nasal deformity, congestion or rhinorrhea.     Comments: Turbinates  nonedematous bilaterally with pink mucosa    Mouth/Throat:     Mouth: Mucous membranes are moist.     Tongue: Tongue does not deviate from midline.     Pharynx: Oropharynx is clear. Uvula midline.     Comments: No tonsillar hypertrophy or exudate Eyes:     General: No scleral icterus.    Conjunctiva/sclera: Conjunctivae normal.     Pupils: Pupils are equal, round, and reactive to light.  Cardiovascular:     Rate and Rhythm: Normal rate.  Pulmonary:     Effort: Pulmonary effort is normal. No respiratory distress.     Breath sounds: No wheezing.  Musculoskeletal:     Cervical back: Normal range of motion and neck supple. No tenderness. No muscular tenderness.  Lymphadenopathy:     Cervical: No cervical adenopathy.  Neurological:     Mental Status: He is alert.      UC Treatments / Results  Labs (all labs ordered are listed, but only abnormal results are displayed) Labs Reviewed - No data to display  EKG   Radiology No results found.  Procedures Procedures (including critical [name] time)  Medications Ordered in UC Medications  methylPREDNISolone sodium succinate (SOLU-MEDROL) 125 mg/2 mL injection 80 mg (has no administration in time range)    Initial Impression / Assessment and Plan / UC Course  I have reviewed the triage vital signs and the nursing notes.  Pertinent labs & imaging results that were available during my [name] of the patient were reviewed by me and considered in my medical decision making (see chart for details).     H&P consistent with AOE: Cortisporin as below.  Given Solu-Medrol in office which he tolerated well.  Provided patient education on sciatica, sports medicine and orthopedic follow-up information as requested.  Return precautions discussed, pt verbalized understanding and is agreeable to plan. Final Clinical Impressions(s) / UC Diagnoses   Final diagnoses:  Acute infective otitis externa, right     Discharge Instructions     Use  eardrops as prescribed for the next week. Return for worsening ear pain, swelling, discharge, bleeding, decreased hearing, development of jaw pain/swelling, fever.  Do NOT use Q-tips as these can cause your ear wax to get stuck, the tips may break off and become a foreign body requiring additional medical [name], or puncture your eardrum.  Helpful prevention tip: Use a solution of equal parts isopropyl (rubbing) alcohol  and white vinegar (acetic acid) in both ears after swimming.    ED Prescriptions    Medication Sig Dispense Auth. Provider   neomycin-polymyxin-hydrocortisone (CORTISPORIN) OTIC solution Place 3 drops into the right ear 4 (four) times daily. 10 mL Jerry-Potvin, [place], PA-C     PDMP not reviewed this encounter.   Odette Fraction [place], [place] 05/24/20 [phone]

## 2020-07-11 ENCOUNTER — Other Ambulatory Visit: Payer: Self-pay

## 2020-07-11 ENCOUNTER — Ambulatory Visit
Admission: EM | Admit: 2020-07-11 | Discharge: 2020-07-11 | Disposition: A | Discharge status: Home / Self Care | Payer: Medicare Other | Attending: Emergency Medicine | Admitting: Emergency Medicine

## 2020-07-11 DIAGNOSIS — J069 Acute upper respiratory infection, unspecified: Secondary | ICD-10-CM

## 2020-07-11 MED ORDER — CETIRIZINE HCL 10 MG PO TABS: 10.0000 mg | ORAL_TABLET | Freq: Every day | ORAL | 0 refills | Status: DC

## 2020-07-11 MED ORDER — FLUTICASONE PROPIONATE 50 MCG/ACT NA SUSP: 1.0000 | Freq: Every day | NASAL | 0 refills | Status: AC

## 2020-07-11 MED ORDER — PREDNISONE 50 MG PO TABS: 50.0000 mg | ORAL_TABLET | Freq: Every day | ORAL | 0 refills | Status: DC

## 2020-07-11 MED ORDER — BENZONATATE 100 MG PO CAPS: 100.0000 mg | ORAL_CAPSULE | Freq: Three times a day (TID) | ORAL | 0 refills | Status: DC

## 2020-07-11 NOTE — ED Provider Notes (Signed)
[facility]    CSN: [id number] Arrival date & time: 07/11/20  1316      History   Chief Complaint Chief Complaint  Patient presents with  . Cough    X 2 days  . Nasal Congestion    X 2 days    HPI Jerry [name] is a 45 y.o. male   History was provided by the patient. Jerry [name] is a 45 y.o. male who presents for evaluation of symptoms of a URI. Symptoms include productive cough, nasal blockage, post nasal drip, sinus and nasal congestion and sore throat. Onset of symptoms was 2 days ago, unchanged since that time. Associated symptoms include no  fever.  He is drinking plenty of fluids. Evaluation to date: none. Treatment to date: none The following portions of the patient's history were reviewed and updated as appropriate: allergies, current medications, past family history, past medical history, past social history, past surgical history and problem list.     Past Medical History:  Diagnosis Date  . Chronic back pain   . Hypertension     Patient Active Problem List   Diagnosis Date Noted  . Leg edema 05/24/2019  . Diabetes mellitus ([place]) 04/19/2019  . Gastroesophageal reflux disease without esophagitis 08/12/2018  . Obesity, morbid, BMI 50 or higher ([place]) 09/15/2014  . Essential hypertension 10/20/2008    Past Surgical History:  Procedure Laterality Date  . ACHILLES TENDON SURGERY    . TOE SURGERY         Home Medications    Prior to Admission medications   Medication Sig Start Date End Date Taking? Authorizing Provider  amLODipine (NORVASC) 5 MG tablet Take 5 mg by mouth daily.   Yes [provider]  benzonatate (TESSALON) 100 MG capsule Take 1 capsule (100 mg total) by mouth every 8 (eight) hours. 07/11/20  Yes Hall-Potvin, [place], PA-C  cetirizine (ZYRTEC ALLERGY) 10 MG tablet Take 1 tablet (10 mg total) by mouth daily. 07/11/20  Yes Hall-Potvin, [place], PA-C  fluticasone (FLONASE) 50 MCG/ACT nasal spray Place 1 spray  into both nostrils daily. 07/11/20  Yes Hall-Potvin, [place], PA-C  hydrALAZINE (APRESOLINE) 100 MG tablet Take 100 mg by mouth 2 (two) times daily.    Yes [provider]  losartan (COZAAR) 100 MG tablet Take 100 mg by mouth daily.   Yes [provider]  naproxen (NAPROSYN) 500 MG tablet Take 1 tablet (500 mg total) by mouth 2 (two) times daily. 03/07/20  Yes Hall-Potvin, [place], PA-C  neomycin-polymyxin-hydrocortisone (CORTISPORIN) OTIC solution Place 3 drops into the right ear 4 (four) times daily. 05/24/20  Yes Hall-Potvin, [place], PA-C  predniSONE (DELTASONE) 50 MG tablet Take 1 tablet (50 mg total) by mouth daily with breakfast. 07/11/20  Yes Hall-Potvin, [place], PA-C  spironolactone (ALDACTONE) 50 MG tablet TAKE 1 TABLET(50 MG) BY MOUTH DAILY 12/11/19   Patwardhan, Reynold Bowen, MD    Family History Family History  Problem Relation Age of Onset  . Heart attack Maternal Grandmother   . Heart attack Paternal Grandmother     Social History Social History   Tobacco Use  . Smoking status: Former Smoker    Years: 1.00    Types: Cigarettes  . Smokeless tobacco: Never Used  . Tobacco comment: few a day   Vaping Use  . Vaping Use: Never used  Substance Use Topics  . Alcohol use: Never  . Drug use: Never     Allergies   Gabapentin   Review of Systems Review  of Systems  Constitutional: Negative for fatigue and fever.  HENT: Positive for congestion. Negative for dental problem, ear pain, facial swelling, hearing loss, sinus pain, sore throat, trouble swallowing and voice change.   Eyes: Negative for photophobia, pain and visual disturbance.  Respiratory: Positive for cough. Negative for shortness of breath.   Cardiovascular: Negative for chest pain and palpitations.  Gastrointestinal: Negative for diarrhea and vomiting.  Musculoskeletal: Negative for arthralgias and myalgias.  Neurological: Negative for dizziness and headaches.     Physical Exam Triage  Vital Signs ED Triage Vitals [07/11/20 1436]  Enc Vitals Group     BP      Pulse      Resp      Temp      Temp src      SpO2      Weight      Height      Head Circumference      Peak Flow      Pain Score 0     Pain Loc      Pain Edu?      Excl. in GC?    No data found.  Updated Vital Signs BP (!) 154/101 (BP Location: Left Arm)   Pulse 73   Temp 97.9 F (36.6 C) (Oral)   Resp 20   SpO2 98%   Visual Acuity Right Eye Distance:   Left Eye Distance:   Bilateral Distance:    Right Eye Near:   Left Eye Near:    Bilateral Near:     Physical Exam Constitutional:      General: He is not in acute distress.    Appearance: He is not toxic-appearing or diaphoretic.  HENT:     Head: Normocephalic and atraumatic.     Right Ear: Tympanic membrane and ear canal normal.     Left Ear: Tympanic membrane and ear canal normal.     Mouth/Throat:     Mouth: Mucous membranes are moist.     Pharynx: Oropharynx is clear.  Eyes:     General: No scleral icterus.    Conjunctiva/sclera: Conjunctivae normal.     Pupils: Pupils are equal, round, and reactive to light.  Neck:     Comments: Trachea midline, negative JVD Cardiovascular:     Rate and Rhythm: Normal rate and regular rhythm.  Pulmonary:     Effort: Pulmonary effort is normal. No respiratory distress.     Breath sounds: Wheezing present.     Comments: Mild, scattered, and without prolonged expiratory phase Musculoskeletal:     Cervical back: Neck supple. No tenderness.  Lymphadenopathy:     Cervical: No cervical adenopathy.  Skin:    Capillary Refill: Capillary refill takes less than 2 seconds.     Coloration: Skin is not jaundiced or pale.     Findings: No rash.  Neurological:     Mental Status: He is alert and oriented to person, place, and time.      UC Treatments / Results  Labs (all labs ordered are listed, but only abnormal results are displayed) Labs Reviewed  NOVEL CORONAVIRUS, NAA     EKG   Radiology No results found.  Procedures Procedures (including critical care time)  Medications Ordered in UC Medications - No data to display  Initial Impression / Assessment and Plan / UC Course  I have reviewed the triage vital signs and the nursing notes.  Pertinent labs & imaging results that were available during my care of the patient were  reviewed by me and considered in my medical decision making (see chart for details).     Patient afebrile, nontoxic, with SpO2 98%.  Covid PCR pending.  Patient to quarantine until results are back.  We will treat supportively as outlined below.  Return precautions discussed, patient verbalized understanding and is agreeable to plan. Final Clinical Impressions(s) / UC Diagnoses   Final diagnoses:  URI with cough and congestion     Discharge Instructions     Tessalon for cough. Start flonase, atrovent nasal spray for nasal congestion/drainage. You can use over the counter nasal saline rinse such as neti pot for nasal congestion. Keep hydrated, your urine should be clear to pale yellow in color. Tylenol/motrin for fever and pain. Monitor for any worsening of symptoms, chest pain, shortness of breath, wheezing, swelling of the throat, go to the emergency department for further evaluation needed.     ED Prescriptions    Medication Sig Dispense Auth. Provider   benzonatate (TESSALON) 100 MG capsule Take 1 capsule (100 mg total) by mouth every 8 (eight) hours. 21 capsule Hall-Potvin, [place], PA-C   cetirizine (ZYRTEC ALLERGY) 10 MG tablet Take 1 tablet (10 mg total) by mouth daily. 30 tablet Hall-Potvin, [place], PA-C   fluticasone (FLONASE) 50 MCG/ACT nasal spray Place 1 spray into both nostrils daily. 16 g Hall-Potvin, [place], PA-C   predniSONE (DELTASONE) 50 MG tablet Take 1 tablet (50 mg total) by mouth daily with breakfast. 5 tablet Hall-Potvin, [place], PA-C     PDMP not reviewed this encounter.   Hall-Potvin,  [place], [place] 07/11/20 1536

## 2020-07-11 NOTE — ED Triage Notes (Signed)
Patient states that he has had a consistent cough and nasal drainage x 2 days. Pt denies any other symptoms. Pt is aox4 and ambulatory.

## 2020-07-11 NOTE — Discharge Instructions (Addendum)

## 2020-07-13 LAB — NOVEL CORONAVIRUS, NAA: SARS-CoV-2, NAA: DETECTED — AB

## 2020-07-13 LAB — SARS-COV-2, NAA 2 DAY TAT

## 2020-07-14 ENCOUNTER — Telehealth: Payer: Self-pay | Admitting: Adult Health

## 2020-07-14 NOTE — Telephone Encounter (Signed)
Called to discuss with patient about COVID-19 symptoms and the use of one of the available treatments for those with mild to moderate Covid symptoms and at a high risk of hospitalization.  Pt appears to qualify for outpatient treatment due to co-morbid conditions and/or a member of an at-risk group in accordance with the FDA Emergency Use Authorization.    Vaccinated: In February and March Qualifiers: Diabetes and obesity  Unable to reach pt - I spoke with his wife and she will have him call in about 15 minutes.   Noreene Filbert

## 2020-08-18 ENCOUNTER — Other Ambulatory Visit: Payer: Self-pay

## 2020-08-18 ENCOUNTER — Ambulatory Visit: Payer: 59 | Attending: Physical Medicine & Rehabilitation

## 2020-08-18 DIAGNOSIS — M62830 Muscle spasm of back: Secondary | ICD-10-CM | POA: Diagnosis present

## 2020-08-18 DIAGNOSIS — M5441 Lumbago with sciatica, right side: Secondary | ICD-10-CM | POA: Insufficient documentation

## 2020-08-18 DIAGNOSIS — R293 Abnormal posture: Secondary | ICD-10-CM | POA: Insufficient documentation

## 2020-08-18 DIAGNOSIS — G8929 Other chronic pain: Secondary | ICD-10-CM | POA: Insufficient documentation

## 2020-08-18 DIAGNOSIS — R2689 Other abnormalities of gait and mobility: Secondary | ICD-10-CM | POA: Diagnosis present

## 2020-08-19 NOTE — Therapy (Addendum)
[place], [place], [address] Phone: [phone]   Fax:  [phone]  Physical Therapy Evaluation/Discharge  Patient Details  Name: Jerry [name] MRN: [id number] Date of Birth: 09-30-1975 Referring Provider (PT): Doreatha Martin, MD   Encounter Date: 08/18/2020   PT End of Session - 08/19/20 1554    Visit Number 1    Number of Visits 9    Date for PT Re-Evaluation 10/17/20    Authorization Type [place] visit 6 and visit 10    PT Start Time 1830    PT Stop Time 1916    PT Time Calculation (min) 46 min    Activity Tolerance Patient limited by lethargy;Patient tolerated treatment well    Behavior During Therapy [facility] for tasks assessed/performed           Past Medical History:  Diagnosis Date  . Chronic back pain   . Hypertension     Past Surgical History:  Procedure Laterality Date  . ACHILLES TENDON SURGERY    . TOE SURGERY      There were no vitals filed for this visit.    Subjective Assessment - 08/18/20 1838    Subjective "I have nerve pain in my lower back. It keeps going up and down and has not been getting better at all. I was actually thinking of moving back home to [place], [place] might move around 08/31/2020 if I do end up moving." Pt reports his LBP began in 1992 or so.    Pertinent History recent Covid-19 per chart/referral order (waited to schedule due to covid)    Limitations Standing;Walking;House hold activities    How long can you sit comfortably? Denies limitations - states sitting provides relief    How long can you stand comfortably? "Probably about an hour"    How long can you walk comfortably? "Probably about an hour"    Diagnostic tests Per chart, MRI July 2020 revealed protruding disc in lumbar region    Patient Stated Goals Being able to work without so much pain. Walking and standing without this pain.    Currently in Pain? Yes    Pain Score 10-Worst pain ever    reports 10/10 pain without physical signs of significant distress   Pain Location Back    Pain Orientation Mid;Lower;Right    Pain Descriptors / Indicators Sharp;Aching;Numbness;Tingling    Pain Type Chronic pain    Pain Radiating Towards down RLE to foot/toes - mostly down posterior thigh and sometimes along R anterior groin    Pain Onset More than a month ago    Pain Frequency Constant    Aggravating Factors  Prolonged standing/walking, leaning back when sitting    Pain Relieving Factors Raising leg up when trying to sleep, icy/hot but does not really have relief    Effect of Pain on Daily Activities Difficulty working and performing daily activities due to constant pain              [facility] PT Assessment - 08/19/20 0001      Assessment   Medical Diagnosis Lumbar spondylosis; R lumbar radiculopathy    Referring Provider (PT) Doreatha Martin, MD    Onset Date/Surgical Date --   1992   Hand Dominance Left    Next MD Visit Nothing scheduled yet    Prior Therapy In [place] ~3 years ago with relief for a while      Precautions   Precautions None  Restrictions   Weight Bearing Restrictions No      Home Social worker Private residence    Living Arrangements Spouse/significant other;Other relatives   wife and her mother   Available Help at Discharge Family    Type of [place] to enter    Entrance Stairs-Number of Steps 3    Entrance Stairs-Rails None    Home Layout One level    Home Equipment None      Prior Function   Level of Independence Independent    Vocation Part time employment    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    Leisure Spend time with his wife, ministry, movies, used to [employer]   Overall Cognitive Status Within Functional Limits for tasks assessed      Observation/Other Assessments   Focus on Therapeutic Outcomes (FOTO)   38% functional status; predicted 55% function      AROM   Lumbar Flexion fingertips to mid shin with mid/right LBP    Lumbar Extension WFL with mid/right LBP    Lumbar - Right Side Bend ~3 inches from superior pole of patella with R LBP    Lumbar - Left Side Bend ~1 inch from superior pole of patella with no pain "just stretch"    Lumbar - Right Rotation WFL with no complaint of pain    Lumbar - Left Rotation WFL with complaint of mid/right LBP      Strength   Right Hip Flexion 4/5    Right Hip ABduction 4/5   pain in R low back   Right Hip ADduction 4/5    Left Hip Flexion 4+/5    Left Hip ABduction 4+/5    Left Hip ADduction 4+/5    Right Knee Flexion 4+/5    Right Knee Extension 4/5    Left Knee Flexion 4+/5    Left Knee Extension 4+/5    Right Ankle Dorsiflexion 5/5    Right Ankle Plantar Flexion 5/5   modified test in sitting   Left Ankle Dorsiflexion 5/5    Left Ankle Plantar Flexion 5/5   modified test in sitting     Palpation   Palpation comment TTP along R piriformis      Special Tests   Other special tests (+) SLUMP                      Objective measurements completed on examination: See above findings.       [facility] Adult PT Treatment/Exercise - 08/19/20 0001      Self-Care   Self-Care Other Self-Care Comments    Other Self-Care Comments  See pt education      Exercises   Exercises Lumbar      Lumbar Exercises: Stretches   Lower Trunk Rotation 5 reps;10 seconds    Piriformis Stretch Right;1 rep;30 seconds    Other Lumbar Stretch Exercise R sciatic nerve glides x 15                  PT Education - 08/19/20 1553    Education Details HEP, anatomy of condition. Used skeleton model to provide visual demonstration of facet joints, anatomy of nerve roots, sciatic nerve, piriformis. FOTO and potential progress. POC.    Person(s) Educated Patient    Methods Explanation;Demonstration;Tactile cues;Verbal cues;Handout    Comprehension  Verbalized understanding;Returned demonstration;Verbal cues required;Tactile cues required;Need further  instruction            PT Short Term Goals - 08/19/20 1428      PT SHORT TERM GOAL #1   Title Pt will be independent with initial HEP.    Baseline Pt provided initial HEP 08/18/2020    Time 4    Period Weeks    Status New    Target Date 09/16/20      PT SHORT TERM GOAL #2   Title Pt will increase RLE MMT to at least 4+/5 grossly    Baseline 4/5 grossly    Time 4    Period Weeks    Status New    Target Date 09/16/20      PT SHORT TERM GOAL #3   Title Pt will report centralization of radicular symptoms and LBP </= 6/10    Baseline reports 10/10 pain with frequent radicular symptoms in RLE    Time 4    Period Weeks    Status New    Target Date 09/16/20      PT SHORT TERM GOAL #4   Title Pt will be able to tolerate standing at least 5-6 hours/day at work    Baseline Pt reports significant pain (10/10) while static standing at work for prolonged time periods - he reports picking up extra long shifts and is constantly on his feet    Time 4    Period Weeks    Status New    Target Date 09/16/20             PT Long Term Goals - 08/19/20 1436      PT LONG TERM GOAL #1   Title Pt will be independent with advanced HEP.    Baseline Pt provided initial HEP at evaluation 08/18/2020.    Time 8    Period Weeks    Status New    Target Date 10/14/20      PT LONG TERM GOAL #2   Title Pt will increase BLE MMT to 5/5 grossly    Baseline RLE 4/5 and LLE 4+/5 grossly    Time 8    Period Weeks    Status New    Target Date 10/14/20      PT LONG TERM GOAL #3   Title Pt will report centralization of radicular symptoms and LBP </= 3/10    Baseline reports 10/10 pain with frequent radicular symptoms in RLE    Time 8    Period Weeks    Status New    Target Date 10/14/20      PT LONG TERM GOAL #4   Title Pt will report being able to sleep comfortably without having to raise his  R leg up for relief.    Baseline Pt reports difficulty sleeping and having to "raise leg" for relief to sleep fairly often    Time 8    Period Weeks    Status New    Target Date 10/14/20      PT LONG TERM GOAL #5   Title Pt's FOTO score will increase from 38% to 55% functional status to demonstrate improved perceived ability.    Baseline 38% functional status; predicted 55% function    Time 8    Period Weeks    Status New    Target Date 10/14/20                  Plan - 08/19/20 1427    Clinical Impression Statement Patient is a 45  year old male who presents to OPPT with complaints of R LBP with radicular symptoms in RLE to knee and occasional referred pain along R anterior groin. Initial evaluation fairly limited secondary to pt being lethargic and frequently dozing off during subjective and while completing FOTO, requiring verbal cues to alert pt and maintain his attention. Pt also mentions that he may be moving to [place], [place] in 2 weeks, but he insisted on continuing evaluation since he does is not sure yet. He presents with R hip deficits and pain in low back with resisted R hip ABD that did not linger. Per chart, pt has severe lumbar spondylosis and MRI July 2020 revealed protruding disc in lumbar region. Referral mentions [facility] eval but pt does not appear to have a clear directional preference upon assessment during eval though he does express relief in sitting with aggravation of symptoms while standing and walking. Pt did have increased symptoms with lumbar R sidebending and L rotation and relief in opposite directions, suggesting potential facet pathology or impingement of nerve root with approximation at facet joint. SLUMP test (+) with positive response to R sciatic nerve glides, LTR, and piriformis stretch. He should benefit from skilled PT intervention to address deficits and improve tolerance with functional tasks, such as standing and walking throughout the day.     Personal Factors and Comorbidities Age;Comorbidity 3+;Fitness;Past/Current Experience;Profession;Time since onset of injury/illness/exacerbation    Comorbidities HTN, GERD, DM    Examination-Activity Limitations Bed Mobility;Bend;Carry;Lift;Locomotion Level;Sleep;Squat;Stairs;Stand    Examination-Participation Restrictions Cleaning;Community Activity;Meal Prep;Occupation;Shop    Stability/Clinical Decision Making Evolving/Moderate complexity    Clinical Decision Making Moderate    Rehab Potential Good    PT Frequency 1x / week    PT Duration 8 weeks    PT Treatment/Interventions ADLs/Self Care Home Management;Aquatic Therapy;Electrical Stimulation;Iontophoresis 8m/ml Dexamethasone;Moist Heat;Traction;Neuromuscular re-education;Balance training;Therapeutic exercise;Therapeutic activities;Functional mobility training;Stair training;Gait training;Patient/family education;Manual techniques;Dry needling;Passive range of motion;Energy conservation;Taping    PT Next Visit Plan Assess PAIVM and consider L lumbar TP UPA mobilizations for potential facet joint pathology and assess response. R sciatic nerve glides. Piriformis stretch and STM. Core/trunk/LE strengthening. Postural awareness and body mechanics while at work?    PT Home Exercise Plan [id number] - R sciatic nerve glides, LTR, piriformis stretch    Consulted and Agree with Plan of Care Patient           Patient will benefit from skilled therapeutic intervention in order to improve the following deficits and impairments:  Decreased activity tolerance,Decreased balance,Decreased mobility,Decreased strength,Decreased endurance,Decreased range of motion,Difficulty walking,Postural dysfunction,Improper body mechanics,Pain,Obesity,Increased muscle spasms  Visit Diagnosis: Chronic right-sided low back pain with right-sided sciatica - Plan: PT plan of care cert/re-cert  Muscle spasm of back - Plan: PT plan of care cert/re-cert  Abnormal posture -  Plan: PT plan of care cert/re-cert  Other abnormalities of gait and mobility - Plan: PT plan of care cert/re-cert     Problem List Patient Active Problem List   Diagnosis Date Noted  . Leg edema 05/24/2019  . Diabetes mellitus ([facility] 04/19/2019  . Gastroesophageal reflux disease without esophagitis 08/12/2018  . Obesity, morbid, BMI 50 or higher ([facility] 09/15/2014  . Essential hypertension 10/20/2008   PHYSICAL THERAPY DISCHARGE SUMMARY  Visits from Start of Care: 1  Current functional level related to goals / functional outcomes: See above   Remaining deficits: See above   Education / Equipment: See above  Plan: Patient agrees to discharge.  Patient goals were not met. Patient is being discharged due to not  returning since the last visit.  ?????     Patient did not return after initial evaluation and now is s/p L4-5 microdiscectomy 09/14/2020.   Haydee Monica, PT, DPT 10/05/20 9:15 AM  [facility] [address] [place], [place], [address] Phone: [phone]   Fax:  [phone]  Name: TORRION WITTER MRN: [id number] Date of Birth: [birth year]

## 2020-08-25 ENCOUNTER — Ambulatory Visit
Admission: EM | Admit: 2020-08-25 | Discharge: 2020-08-25 | Disposition: A | Discharge status: Home / Self Care | Payer: 59 | Attending: Emergency Medicine | Admitting: Emergency Medicine

## 2020-08-25 ENCOUNTER — Other Ambulatory Visit: Payer: Self-pay

## 2020-08-25 ENCOUNTER — Encounter: Payer: Self-pay | Admitting: Emergency Medicine

## 2020-08-25 DIAGNOSIS — J209 Acute bronchitis, unspecified: Secondary | ICD-10-CM

## 2020-08-25 MED ORDER — DEXAMETHASONE SODIUM PHOSPHATE 10 MG/ML IJ SOLN
10.0000 mg | Freq: Once | INTRAMUSCULAR | Status: AC
  Administered 2020-08-25: 10 mg via INTRAMUSCULAR

## 2020-08-25 MED ORDER — ALBUTEROL SULFATE HFA 108 (90 BASE) MCG/ACT IN AERS: 1.0000 | INHALATION_SPRAY | Freq: Four times a day (QID) | RESPIRATORY_TRACT | 0 refills | Status: AC | PRN

## 2020-08-25 MED ORDER — PREDNISONE 10 MG PO TABS: ORAL_TABLET | ORAL | 0 refills | Status: DC

## 2020-08-25 MED ORDER — DM-GUAIFENESIN ER 30-600 MG PO TB12: 1.0000 | ORAL_TABLET | Freq: Two times a day (BID) | ORAL | 0 refills | Status: DC

## 2020-08-25 MED ORDER — BENZONATATE 200 MG PO CAPS: 200.0000 mg | ORAL_CAPSULE | Freq: Three times a day (TID) | ORAL | 0 refills | Status: AC | PRN

## 2020-08-25 NOTE — ED Provider Notes (Signed)
[facility]    CSN: [id number] Arrival date & time: 08/25/20  1400      History   Chief Complaint Chief Complaint  Patient presents with  . Cough    HPI Keyron SADIK PIASCIK is a 45 y.o. male history of hypertension presenting today for evaluation of cough and congestion.  Reports 5 days of coughing with associated shortness of breath and wheezing.  Does report history of asthma.  Denies any tobacco use.  Reports possible subjective fevers and chills, denies body aches.  Denies known exposures.  Feels similar to prior bronchitis spells.  HPI  Past Medical History:  Diagnosis Date  . Chronic back pain   . Hypertension     Patient Active Problem List   Diagnosis Date Noted  . Leg edema 05/24/2019  . Diabetes mellitus (HCC) 04/19/2019  . Gastroesophageal reflux disease without esophagitis 08/12/2018  . Obesity, morbid, BMI 50 or higher (HCC) 09/15/2014  . Essential hypertension 10/20/2008    Past Surgical History:  Procedure Laterality Date  . ACHILLES TENDON SURGERY    . TOE SURGERY         Home Medications    Prior to Admission medications   Medication Sig Start Date End Date Taking? Authorizing Provider  albuterol (VENTOLIN HFA) 108 (90 Base) MCG/ACT inhaler Inhale 1-2 puffs into the lungs every 6 (six) hours as needed for wheezing or shortness of breath. 08/25/20  Yes Floriene Jeschke C, PA-C  benzonatate (TESSALON) 200 MG capsule Take 1 capsule (200 mg total) by mouth 3 (three) times daily as needed for up to 7 days for cough. 08/25/20 09/01/20 Yes Larkin Alfred C, PA-C  dextromethorphan-guaiFENesin (MUCINEX DM) 30-600 MG 12hr tablet Take 1 tablet by mouth 2 (two) times daily. 08/25/20  Yes Othel Hoogendoorn C, PA-C  predniSONE (DELTASONE) 10 MG tablet Begin with 6 tabs on day 1, 5 tab on day 2, 4 tab on day 3, 3 tab on day 4, 2 tab on day 5, 1 tab on day 6-take with food 08/25/20  Yes Lyla Jasek C, PA-C  amLODipine (NORVASC) 5 MG tablet Take 5 mg by  mouth daily.    [provider]  cetirizine (ZYRTEC ALLERGY) 10 MG tablet Take 1 tablet (10 mg total) by mouth daily. 07/11/20   Hall-Potvin, [place], PA-C  fluticasone (FLONASE) 50 MCG/ACT nasal spray Place 1 spray into both nostrils daily. 07/11/20   Hall-Potvin, [place], PA-C  hydrALAZINE (APRESOLINE) 100 MG tablet Take 100 mg by mouth 2 (two) times daily.     [provider]  losartan (COZAAR) 100 MG tablet Take 100 mg by mouth daily.    [provider]  naproxen (NAPROSYN) 500 MG tablet Take 1 tablet (500 mg total) by mouth 2 (two) times daily. 03/07/20   Hall-Potvin, [place], PA-C  spironolactone (ALDACTONE) 50 MG tablet TAKE 1 TABLET(50 MG) BY MOUTH DAILY 12/11/19   Patwardhan, Anabel Bene, MD    Family History Family History  Problem Relation Age of Onset  . Heart attack Maternal Grandmother   . Heart attack Paternal Grandmother     Social History Social History   Tobacco Use  . Smoking status: Former Smoker    Years: 1.00    Types: Cigarettes  . Smokeless tobacco: Never Used  . Tobacco comment: few a day   Vaping Use  . Vaping Use: Never used  Substance Use Topics  . Alcohol use: Never  . Drug use: Never     Allergies   Gabapentin  Review of Systems Review of Systems  Constitutional: Positive for chills. Negative for activity change, appetite change, fatigue and fever.  HENT: Positive for congestion and rhinorrhea. Negative for ear pain, sinus pressure, sore throat and trouble swallowing.   Eyes: Negative for discharge and redness.  Respiratory: Positive for cough, shortness of breath and wheezing. Negative for chest tightness.   Cardiovascular: Negative for chest pain.  Gastrointestinal: Negative for abdominal pain, diarrhea, nausea and vomiting.  Musculoskeletal: Negative for myalgias.  Skin: Negative for rash.  Neurological: Negative for dizziness, light-headedness and headaches.     Physical Exam Triage Vital Signs ED Triage  Vitals  Enc Vitals Group     BP 08/25/20 1627 (!) 188/84     Pulse Rate 08/25/20 1627 81     Resp 08/25/20 1627 18     Temp 08/25/20 1627 97.9 F (36.6 C)     Temp Source 08/25/20 1627 Oral     SpO2 08/25/20 1627 96 %     Weight --      Height --      Head Circumference --      Peak Flow --      Pain Score 08/25/20 1628 5     Pain Loc --      Pain Edu? --      Excl. in GC? --    No data found.  Updated Vital Signs BP (!) 188/84 (BP Location: Left Arm)   Pulse 81   Temp 97.9 F (36.6 C) (Oral)   Resp 18   SpO2 96%   Visual Acuity Right Eye Distance:   Left Eye Distance:   Bilateral Distance:    Right Eye Near:   Left Eye Near:    Bilateral Near:     Physical Exam Vitals and nursing note reviewed.  Constitutional:      Appearance: He is well-developed and well-nourished.     Comments: No acute distress  HENT:     Head: Normocephalic and atraumatic.     Ears:     Comments: Bilateral ears without tenderness to palpation of external auricle, tragus and mastoid, EAC's without erythema or swelling, TM's with good bony landmarks and cone of light. Non erythematous.     Nose: Nose normal.     Mouth/Throat:     Comments: Oral mucosa pink and moist, no tonsillar enlargement or exudate. Posterior pharynx patent and nonerythematous, no uvula deviation or swelling. Normal phonation. Eyes:     Conjunctiva/sclera: Conjunctivae normal.  Cardiovascular:     Rate and Rhythm: Normal rate.  Pulmonary:     Effort: Pulmonary effort is normal. No respiratory distress.     Comments: Breathing comfortably at rest, CTABL, no wheezing, rales or other adventitious sounds auscultated Abdominal:     General: There is no distension.  Musculoskeletal:        General: Normal range of motion.     Cervical back: Neck supple.  Skin:    General: Skin is warm and dry.  Neurological:     Mental Status: He is alert and oriented to person, place, and time.  Psychiatric:        Mood and  Affect: Mood and affect normal.      UC Treatments / Results  Labs (all labs ordered are listed, but only abnormal results are displayed) Labs Reviewed - No data to display  EKG   Radiology No results found.  Procedures Procedures (including critical care time)  Medications Ordered in UC Medications  dexamethasone (DECADRON)  injection 10 mg (10 mg Intramuscular Given 08/25/20 1712)    Initial Impression / Assessment and Plan / UC Course  I have reviewed the triage vital signs and the nursing notes.  Pertinent labs & imaging results that were available during my care of the patient were reviewed by me and considered in my medical decision making (see chart for details).     5 days of cough and congestion, no significant wheezing noted on exam today, but will blood and proceed with treatment for bronchitis given patient's reported symptoms.  Providing dose of steroids prior to discharge per patient request and continuing on prednisone taper x6 days, Tessalon for cough, Mucinex for congestion, albuterol inhaler as needed.  Continue to monitor symptoms and breathing,Discussed strict return precautions. Patient verbalized understanding and is agreeable with plan.  Final Clinical Impressions(s) / UC Diagnoses   Final diagnoses:  Acute bronchitis, unspecified organism     Discharge Instructions     We are treating you for bronchitis Albuterol inhaler 1 to 2 puffs every 4-6 hours as needed for chest tightness, shortness of breath and wheezing Begin prednisone taper over the next 6 days-begin with 6 tablets on day 1, decrease by 1 tablet each day until complete-6, 5, 4, 3, 2, 1-take with food and in the morning if you are able Tessalon/benzonatate every 8 hours for cough Mucinex DM to further help with congestion and cough Rest and fluids, honey and hot tea with lemon, ginger Follow-up if not improving or worsening     ED Prescriptions    Medication Sig Dispense Auth.  Provider   predniSONE (DELTASONE) 10 MG tablet Begin with 6 tabs on day 1, 5 tab on day 2, 4 tab on day 3, 3 tab on day 4, 2 tab on day 5, 1 tab on day 6-take with food 21 tablet Krista Som C, PA-C   benzonatate (TESSALON) 200 MG capsule Take 1 capsule (200 mg total) by mouth 3 (three) times daily as needed for up to 7 days for cough. 28 capsule Sophiana Milanese C, PA-C   dextromethorphan-guaiFENesin (MUCINEX DM) 30-600 MG 12hr tablet Take 1 tablet by mouth 2 (two) times daily. 20 tablet Jordon Kristiansen C, PA-C   albuterol (VENTOLIN HFA) 108 (90 Base) MCG/ACT inhaler Inhale 1-2 puffs into the lungs every 6 (six) hours as needed for wheezing or shortness of breath. 8 g Hervey Wedig, [place] C, PA-C     PDMP not reviewed this encounter.   Lew Dawes, PA-C 08/25/20 1721

## 2020-08-25 NOTE — Discharge Instructions (Addendum)
We are treating you for bronchitis Albuterol inhaler 1 to 2 puffs every 4-6 hours as needed for chest tightness, shortness of breath and wheezing Begin prednisone taper over the next 6 days-begin with 6 tablets on day 1, decrease by 1 tablet each day until complete-6, 5, 4, 3, 2, 1-take with food and in the morning if you are able Tessalon/benzonatate every 8 hours for cough Mucinex DM to further help with congestion and cough Rest and fluids, honey and hot tea with lemon, ginger Follow-up if not improving or worsening

## 2020-08-25 NOTE — ED Triage Notes (Signed)
Pt here for coughing and congestion x 5 days; pt sts had covid recently and does not want testing; pt sts hx of asthma

## 2020-09-08 ENCOUNTER — Other Ambulatory Visit: Payer: Self-pay | Admitting: Neurosurgery

## 2020-09-10 ENCOUNTER — Encounter (HOSPITAL_COMMUNITY): Payer: Self-pay

## 2020-09-10 ENCOUNTER — Other Ambulatory Visit: Payer: Self-pay

## 2020-09-10 ENCOUNTER — Encounter (HOSPITAL_COMMUNITY)
Admission: RE | Admit: 2020-09-10 | Discharge: 2020-09-10 | Disposition: A | Discharge status: Home / Self Care | Payer: 59 | Source: Ambulatory Visit | Attending: Neurosurgery | Admitting: Neurosurgery

## 2020-09-10 DIAGNOSIS — I444 Left anterior fascicular block: Secondary | ICD-10-CM | POA: Diagnosis not present

## 2020-09-10 DIAGNOSIS — Z01818 Encounter for other preprocedural examination: Secondary | ICD-10-CM | POA: Diagnosis present

## 2020-09-10 LAB — CBC WITH DIFFERENTIAL/PLATELET
Abs Immature Granulocytes: 0.04 10*3/uL (ref 0.00–0.07)
Basophils Absolute: 0.1 10*3/uL (ref 0.0–0.1)
Basophils Relative: 1 %
Eosinophils Absolute: 0.2 10*3/uL (ref 0.0–0.5)
Eosinophils Relative: 4 %
HCT: 40.2 % (ref 39.0–52.0)
Hemoglobin: 13.5 g/dL (ref 13.0–17.0)
Immature Granulocytes: 1 %
Lymphocytes Relative: 29 %
Lymphs Abs: 1.9 10*3/uL (ref 0.7–4.0)
MCH: 29.5 pg (ref 26.0–34.0)
MCHC: 33.6 g/dL (ref 30.0–36.0)
MCV: 88 fL (ref 80.0–100.0)
Monocytes Absolute: 0.6 10*3/uL (ref 0.1–1.0)
Monocytes Relative: 9 %
Neutro Abs: 3.6 10*3/uL (ref 1.7–7.7)
Neutrophils Relative %: 56 %
Platelets: 181 10*3/uL (ref 150–400)
RBC: 4.57 MIL/uL (ref 4.22–5.81)
RDW: 13.7 % (ref 11.5–15.5)
WBC: 6.3 10*3/uL (ref 4.0–10.5)
nRBC: 0 % (ref 0.0–0.2)

## 2020-09-10 LAB — BASIC METABOLIC PANEL
Anion gap: 9 (ref 5–15)
BUN: 19 mg/dL (ref 6–20)
CO2: 20 mmol/L — ABNORMAL LOW (ref 22–32)
Calcium: 9 mg/dL (ref 8.9–10.3)
Chloride: 110 mmol/L (ref 98–111)
Creatinine, Ser: 1.32 mg/dL — ABNORMAL HIGH (ref 0.61–1.24)
GFR, Estimated: >60 mL/min (ref >60)
Glucose, Bld: 90 mg/dL (ref 70–99)
Potassium: 3.8 mmol/L (ref 3.5–5.1)
Sodium: 139 mmol/L (ref 135–145)

## 2020-09-10 LAB — SURGICAL PCR SCREEN
MRSA, PCR: NEGATIVE
Staphylococcus aureus: NEGATIVE

## 2020-09-10 NOTE — Progress Notes (Signed)
PCP - Earl Gala, PA-C Cardiologist -   Chest x-ray -  EKG - 09/10/20 Stress Test -  ECHO -  Cardiac Cath -   Sleep Study - yes CPAP - does not wear CPAP  COVID TEST- no - pt tested postitive 07/11/20 - asymptomatic today   Anesthesia review: n/a  Patient denies shortness of breath, fever, cough and chest pain at PAT appointment   All instructions explained to the patient, with a verbal understanding of the material. Patient agrees to go over the instructions while at home for a better understanding. Patient also instructed to self quarantine after being tested for COVID-19. The opportunity to ask questions was provided.

## 2020-09-10 NOTE — Progress Notes (Signed)
Your procedure is scheduled on Monday March 7.  Report to [facility] Main Entrance "A" at 11:30 A.M., and check in at the Admitting office.  Call this number if you have problems the morning of surgery: [phone]  Call [phone] if you have any questions prior to your surgery date Monday-Friday 8am-4pm   Remember: Do not eat or drink after midnight the night before your surgery   Take these medicines the morning of surgery with A SIP OF WATER: amLODipine (NORVASC)  cetirizine (ZYRTEC ALLERGY)  fluticasone (FLONASE)  hydrALAZINE (APRESOLINE)  predniSONE (DELTASONE)  If needed: albuterol (VENTOLIN HFA) --- Please bring all inhalers with you the day of surgery.    As of today, STOP taking any Aspirin (unless otherwise instructed by your surgeon), Aleve, Naproxen, Ibuprofen, Motrin, Advil, Goody's, BC's, all herbal medications, fish oil, and all vitamins.    The Morning of Surgery  Do not wear jewelry  Do not wear lotions, powders, colognes, or deodorant  Men may shave face and neck.  Do not bring valuables to the hospital.  [facility] is not responsible for any belongings or valuables.  If you are a smoker, DO NOT Smoke 24 hours prior to surgery  If you wear a CPAP at night please bring your mask the morning of surgery   Remember that you must have someone to transport you home after your surgery, and remain with you for 24 hours if you are discharged the same day.   Please bring cases for contacts, glasses, hearing aids, dentures or bridgework because it cannot be worn into surgery.    Leave your suitcase in the car.  After surgery it may be brought to your room.  For patients admitted to the hospital, discharge time will be determined by your treatment team.  Patients discharged the day of surgery will not be allowed to drive home.    Special instructions:   [place]- Preparing For Surgery  Before surgery, you can play an important role. Because skin  is not sterile, your skin needs to be as free of germs as possible. You can reduce the number of germs on your skin by washing with CHG (chlorahexidine gluconate) Soap before surgery.  CHG is an antiseptic cleaner which kills germs and bonds with the skin to continue killing germs even after washing.    Oral Hygiene is also important to reduce your risk of infection.  Remember - BRUSH YOUR TEETH THE MORNING OF SURGERY WITH YOUR REGULAR TOOTHPASTE  Please do not use if you have an allergy to CHG or antibacterial soaps. If your skin becomes reddened/irritated stop using the CHG.  Do not shave (including legs and underarms) for at least 48 hours prior to first CHG shower. It is OK to shave your face.  Please follow these instructions carefully.   1. Shower the NIGHT BEFORE SURGERY and the MORNING OF SURGERY with CHG Soap.   2. If you chose to wash your hair and body, wash as usual with your normal shampoo and body-wash/soap.  3. Rinse your hair and body thoroughly to remove the shampoo and soap.  4. Apply CHG directly to the skin (ONLY FROM THE NECK DOWN) and wash gently with a scrungie or a clean washcloth.   5. Do not use on open wounds or open sores. Avoid contact with your eyes, ears, mouth and genitals (private parts). Wash Face and genitals (private parts)  with your normal soap.   6. Wash thoroughly, paying special attention to  the area where your surgery will be performed.  7. Thoroughly rinse your body with warm water from the neck down.  8. DO NOT shower/wash with your normal soap after using and rinsing off the CHG Soap.  9. Pat yourself dry with a CLEAN TOWEL.  10. Wear CLEAN PAJAMAS to bed the night before surgery  11. Place CLEAN SHEETS on your bed the night of your first shower and DO NOT SLEEP WITH PETS.  12. Wear comfortable clothes the morning of surgery.     Day of Surgery:  Please shower the morning of surgery with the CHG soap Do not apply any  deodorants/lotions. Please wear clean clothes to the hospital/surgery center.   Remember to brush your teeth WITH YOUR REGULAR TOOTHPASTE.   Please read over the following fact sheets that you were given.

## 2020-09-11 MED ORDER — DEXTROSE 5 % IV SOLN
3.0000 g | INTRAVENOUS | Status: AC
  Administered 2020-09-14: 3 g via INTRAVENOUS
  Filled 2020-09-11: qty 3000
  Filled 2020-09-11: qty 3

## 2020-09-14 ENCOUNTER — Ambulatory Visit (HOSPITAL_COMMUNITY): Payer: Medicare Other

## 2020-09-14 ENCOUNTER — Encounter (HOSPITAL_COMMUNITY): Payer: Self-pay | Admitting: Neurosurgery

## 2020-09-14 ENCOUNTER — Observation Stay (HOSPITAL_COMMUNITY)
Admission: RE | Admit: 2020-09-14 | Discharge: 2020-09-15 | Disposition: A | Discharge status: Home / Self Care | Payer: Medicare Other | Attending: Neurosurgery | Admitting: Neurosurgery

## 2020-09-14 ENCOUNTER — Encounter (HOSPITAL_COMMUNITY)
Admission: RE | Disposition: A | Discharge status: Home / Self Care | Payer: Self-pay | Source: Home / Self Care | Attending: Neurosurgery

## 2020-09-14 ENCOUNTER — Other Ambulatory Visit: Payer: Self-pay

## 2020-09-14 ENCOUNTER — Ambulatory Visit (HOSPITAL_COMMUNITY): Payer: Medicare Other | Admitting: Certified Registered"

## 2020-09-14 DIAGNOSIS — Z87891 Personal history of nicotine dependence: Secondary | ICD-10-CM | POA: Diagnosis not present

## 2020-09-14 DIAGNOSIS — Z79899 Other long term (current) drug therapy: Secondary | ICD-10-CM | POA: Insufficient documentation

## 2020-09-14 DIAGNOSIS — M5416 Radiculopathy, lumbar region: Secondary | ICD-10-CM | POA: Diagnosis present

## 2020-09-14 DIAGNOSIS — M48061 Spinal stenosis, lumbar region without neurogenic claudication: Secondary | ICD-10-CM | POA: Diagnosis not present

## 2020-09-14 DIAGNOSIS — M4726 Other spondylosis with radiculopathy, lumbar region: Secondary | ICD-10-CM | POA: Diagnosis not present

## 2020-09-14 DIAGNOSIS — Z419 Encounter for procedure for purposes other than remedying health state, unspecified: Secondary | ICD-10-CM

## 2020-09-14 DIAGNOSIS — I10 Essential (primary) hypertension: Secondary | ICD-10-CM | POA: Diagnosis not present

## 2020-09-14 DIAGNOSIS — M5116 Intervertebral disc disorders with radiculopathy, lumbar region: Secondary | ICD-10-CM | POA: Diagnosis not present

## 2020-09-14 DIAGNOSIS — M545 Low back pain, unspecified: Secondary | ICD-10-CM | POA: Diagnosis present

## 2020-09-14 HISTORY — PX: LUMBAR LAMINECTOMY/DECOMPRESSION MICRODISCECTOMY: SHX5026

## 2020-09-14 SURGERY — LUMBAR LAMINECTOMY/DECOMPRESSION MICRODISCECTOMY 1 LEVEL
Anesthesia: General | Site: Back | Laterality: Right

## 2020-09-14 MED ORDER — ACETAMINOPHEN 650 MG RE SUPP: 650.0000 mg | RECTAL | Status: DC | PRN

## 2020-09-14 MED ORDER — FENTANYL CITRATE (PF) 100 MCG/2ML IJ SOLN
25.0000 ug | INTRAMUSCULAR | Status: DC | PRN
  Administered 2020-09-14: 50 ug via INTRAVENOUS

## 2020-09-14 MED ORDER — SODIUM CHLORIDE 0.9% FLUSH: 3.0000 mL | Freq: Two times a day (BID) | INTRAVENOUS | Status: DC

## 2020-09-14 MED ORDER — KETOROLAC TROMETHAMINE 30 MG/ML IJ SOLN
30.0000 mg | Freq: Four times a day (QID) | INTRAMUSCULAR | Status: DC
  Administered 2020-09-14 – 2020-09-15 (×2): 30 mg via INTRAVENOUS
  Filled 2020-09-14 (×2): qty 1

## 2020-09-14 MED ORDER — ALBUTEROL SULFATE (2.5 MG/3ML) 0.083% IN NEBU: 2.5000 mg | INHALATION_SOLUTION | Freq: Four times a day (QID) | RESPIRATORY_TRACT | Status: DC | PRN

## 2020-09-14 MED ORDER — ONDANSETRON HCL 4 MG/2ML IJ SOLN
INTRAMUSCULAR | Status: DC | PRN
  Administered 2020-09-14: 4 mg via INTRAVENOUS

## 2020-09-14 MED ORDER — DM-GUAIFENESIN ER 30-600 MG PO TB12
1.0000 | ORAL_TABLET | Freq: Two times a day (BID) | ORAL | Status: DC | PRN
  Filled 2020-09-14: qty 1

## 2020-09-14 MED ORDER — CHLORHEXIDINE GLUCONATE 0.12 % MT SOLN
OROMUCOSAL | Status: AC
  Administered 2020-09-14: 15 mL via OROMUCOSAL
  Filled 2020-09-14: qty 15

## 2020-09-14 MED ORDER — PHENOL 1.4 % MT LIQD: 1.0000 | OROMUCOSAL | Status: DC | PRN

## 2020-09-14 MED ORDER — PROPOFOL 10 MG/ML IV BOLUS
INTRAVENOUS | Status: AC
  Filled 2020-09-14: qty 20

## 2020-09-14 MED ORDER — HYDROMORPHONE HCL 1 MG/ML IJ SOLN
1.0000 mg | INTRAMUSCULAR | Status: DC | PRN
  Administered 2020-09-14: 1 mg via INTRAVENOUS
  Filled 2020-09-14: qty 1

## 2020-09-14 MED ORDER — OXYCODONE HCL 5 MG/5ML PO SOLN: 5.0000 mg | Freq: Once | ORAL | Status: DC | PRN

## 2020-09-14 MED ORDER — PHENYLEPHRINE HCL-NACL 10-0.9 MG/250ML-% IV SOLN
INTRAVENOUS | Status: AC
  Filled 2020-09-14: qty 250

## 2020-09-14 MED ORDER — THROMBIN 5000 UNITS EX SOLR
CUTANEOUS | Status: DC | PRN
  Administered 2020-09-14: 5000 [IU] via TOPICAL

## 2020-09-14 MED ORDER — CHLORHEXIDINE GLUCONATE CLOTH 2 % EX PADS: 6.0000 | MEDICATED_PAD | Freq: Once | CUTANEOUS | Status: DC

## 2020-09-14 MED ORDER — MIDAZOLAM HCL 5 MG/5ML IJ SOLN
INTRAMUSCULAR | Status: DC | PRN
  Administered 2020-09-14: 2 mg via INTRAVENOUS

## 2020-09-14 MED ORDER — SUGAMMADEX SODIUM 200 MG/2ML IV SOLN
INTRAVENOUS | Status: DC | PRN
  Administered 2020-09-14: 400 mg via INTRAVENOUS

## 2020-09-14 MED ORDER — MENTHOL 3 MG MT LOZG: 1.0000 | LOZENGE | OROMUCOSAL | Status: DC | PRN

## 2020-09-14 MED ORDER — FENTANYL CITRATE (PF) 100 MCG/2ML IJ SOLN
INTRAMUSCULAR | Status: AC
  Filled 2020-09-14: qty 2

## 2020-09-14 MED ORDER — EPHEDRINE SULFATE 50 MG/ML IJ SOLN
INTRAMUSCULAR | Status: DC | PRN
  Administered 2020-09-14: 10 mg via INTRAVENOUS
  Administered 2020-09-14: 5 mg via INTRAVENOUS

## 2020-09-14 MED ORDER — FENTANYL CITRATE (PF) 250 MCG/5ML IJ SOLN
INTRAMUSCULAR | Status: AC
  Filled 2020-09-14: qty 5

## 2020-09-14 MED ORDER — HYDROCODONE-ACETAMINOPHEN 10-325 MG PO TABS
2.0000 | ORAL_TABLET | ORAL | Status: DC | PRN
  Administered 2020-09-14 – 2020-09-15 (×3): 2 via ORAL
  Filled 2020-09-14 (×3): qty 2

## 2020-09-14 MED ORDER — PROPOFOL 10 MG/ML IV BOLUS
INTRAVENOUS | Status: DC | PRN
  Administered 2020-09-14: 200 mg via INTRAVENOUS

## 2020-09-14 MED ORDER — HEMOSTATIC AGENTS (NO CHARGE) OPTIME
TOPICAL | Status: DC | PRN
  Administered 2020-09-14: 1 via TOPICAL

## 2020-09-14 MED ORDER — DEXAMETHASONE SODIUM PHOSPHATE 10 MG/ML IJ SOLN: 10.0000 mg | Freq: Once | INTRAMUSCULAR | Status: DC

## 2020-09-14 MED ORDER — LACTATED RINGERS IV SOLN: INTRAVENOUS | Status: DC | PRN

## 2020-09-14 MED ORDER — ROCURONIUM BROMIDE 10 MG/ML (PF) SYRINGE
PREFILLED_SYRINGE | INTRAVENOUS | Status: AC
  Filled 2020-09-14: qty 10

## 2020-09-14 MED ORDER — ONDANSETRON HCL 4 MG PO TABS: 4.0000 mg | ORAL_TABLET | Freq: Four times a day (QID) | ORAL | Status: DC | PRN

## 2020-09-14 MED ORDER — PANTOPRAZOLE SODIUM 40 MG PO TBEC: 40.0000 mg | DELAYED_RELEASE_TABLET | Freq: Every day | ORAL | Status: DC

## 2020-09-14 MED ORDER — PHENYLEPHRINE HCL (PRESSORS) 10 MG/ML IV SOLN
INTRAVENOUS | Status: DC | PRN
  Administered 2020-09-14: 80 ug via INTRAVENOUS

## 2020-09-14 MED ORDER — ROCURONIUM BROMIDE 10 MG/ML (PF) SYRINGE
PREFILLED_SYRINGE | INTRAVENOUS | Status: DC | PRN
  Administered 2020-09-14: 10 mg via INTRAVENOUS
  Administered 2020-09-14: 60 mg via INTRAVENOUS

## 2020-09-14 MED ORDER — FLUTICASONE PROPIONATE 50 MCG/ACT NA SUSP
1.0000 | Freq: Every day | NASAL | Status: DC | PRN
  Filled 2020-09-14: qty 16

## 2020-09-14 MED ORDER — SPIRONOLACTONE 25 MG PO TABS: 50.0000 mg | ORAL_TABLET | Freq: Every day | ORAL | Status: DC

## 2020-09-14 MED ORDER — BUPIVACAINE HCL (PF) 0.25 % IJ SOLN
INTRAMUSCULAR | Status: AC
  Filled 2020-09-14: qty 30

## 2020-09-14 MED ORDER — SUCCINYLCHOLINE CHLORIDE 200 MG/10ML IV SOSY
PREFILLED_SYRINGE | INTRAVENOUS | Status: AC
  Filled 2020-09-14: qty 10

## 2020-09-14 MED ORDER — LORATADINE 10 MG PO TABS: 10.0000 mg | ORAL_TABLET | Freq: Every day | ORAL | Status: DC

## 2020-09-14 MED ORDER — SODIUM CHLORIDE 0.9 % IV SOLN: 250.0000 mL | INTRAVENOUS | Status: DC

## 2020-09-14 MED ORDER — KETOROLAC TROMETHAMINE 30 MG/ML IJ SOLN
30.0000 mg | Freq: Once | INTRAMUSCULAR | Status: AC
  Administered 2020-09-14: 30 mg via INTRAVENOUS

## 2020-09-14 MED ORDER — KETOROLAC TROMETHAMINE 15 MG/ML IJ SOLN
30.0000 mg | Freq: Four times a day (QID) | INTRAMUSCULAR | Status: DC
  Administered 2020-09-14: 30 mg via INTRAVENOUS
  Filled 2020-09-14: qty 2

## 2020-09-14 MED ORDER — DEXAMETHASONE SODIUM PHOSPHATE 10 MG/ML IJ SOLN
INTRAMUSCULAR | Status: AC
  Filled 2020-09-14: qty 1

## 2020-09-14 MED ORDER — LIDOCAINE 2% (20 MG/ML) 5 ML SYRINGE
INTRAMUSCULAR | Status: AC
  Filled 2020-09-14: qty 5

## 2020-09-14 MED ORDER — FENTANYL CITRATE (PF) 250 MCG/5ML IJ SOLN
INTRAMUSCULAR | Status: DC | PRN
  Administered 2020-09-14: 150 ug via INTRAVENOUS

## 2020-09-14 MED ORDER — PHENYLEPHRINE HCL-NACL 10-0.9 MG/250ML-% IV SOLN
INTRAVENOUS | Status: DC | PRN
  Administered 2020-09-14: 40 ug/min via INTRAVENOUS

## 2020-09-14 MED ORDER — ORAL CARE MOUTH RINSE: 15.0000 mL | Freq: Once | OROMUCOSAL | Status: AC

## 2020-09-14 MED ORDER — LACTATED RINGERS IV SOLN: INTRAVENOUS | Status: DC

## 2020-09-14 MED ORDER — THROMBIN 5000 UNITS EX KIT
PACK | CUTANEOUS | Status: AC
  Filled 2020-09-14: qty 2

## 2020-09-14 MED ORDER — KETOROLAC TROMETHAMINE 30 MG/ML IJ SOLN
INTRAMUSCULAR | Status: DC | PRN
  Administered 2020-09-14: 30 mg via INTRAVENOUS

## 2020-09-14 MED ORDER — CYCLOBENZAPRINE HCL 10 MG PO TABS
10.0000 mg | ORAL_TABLET | Freq: Three times a day (TID) | ORAL | Status: DC | PRN
  Administered 2020-09-14: 10 mg via ORAL
  Filled 2020-09-14: qty 1

## 2020-09-14 MED ORDER — 0.9 % SODIUM CHLORIDE (POUR BTL) OPTIME
TOPICAL | Status: DC | PRN
  Administered 2020-09-14: 1000 mL

## 2020-09-14 MED ORDER — HYDROCODONE-ACETAMINOPHEN 5-325 MG PO TABS: 1.0000 | ORAL_TABLET | ORAL | Status: DC | PRN

## 2020-09-14 MED ORDER — SODIUM CHLORIDE 0.9% FLUSH: 3.0000 mL | INTRAVENOUS | Status: DC | PRN

## 2020-09-14 MED ORDER — ACETAMINOPHEN 325 MG PO TABS: 650.0000 mg | ORAL_TABLET | ORAL | Status: DC | PRN

## 2020-09-14 MED ORDER — ACETAMINOPHEN 10 MG/ML IV SOLN: 1000.0000 mg | Freq: Once | INTRAVENOUS | Status: DC | PRN

## 2020-09-14 MED ORDER — HYDRALAZINE HCL 50 MG PO TABS
100.0000 mg | ORAL_TABLET | Freq: Two times a day (BID) | ORAL | Status: DC
  Administered 2020-09-14: 100 mg via ORAL
  Filled 2020-09-14 (×3): qty 2

## 2020-09-14 MED ORDER — MIDAZOLAM HCL 2 MG/2ML IJ SOLN
INTRAMUSCULAR | Status: AC
  Filled 2020-09-14: qty 2

## 2020-09-14 MED ORDER — KETOROLAC TROMETHAMINE 30 MG/ML IJ SOLN
INTRAMUSCULAR | Status: AC
  Filled 2020-09-14: qty 1

## 2020-09-14 MED ORDER — TAMSULOSIN HCL 0.4 MG PO CAPS: 0.4000 mg | ORAL_CAPSULE | Freq: Every day | ORAL | Status: DC

## 2020-09-14 MED ORDER — LIDOCAINE 2% (20 MG/ML) 5 ML SYRINGE
INTRAMUSCULAR | Status: DC | PRN
  Administered 2020-09-14: 60 mg via INTRAVENOUS

## 2020-09-14 MED ORDER — ONDANSETRON HCL 4 MG/2ML IJ SOLN: 4.0000 mg | Freq: Four times a day (QID) | INTRAMUSCULAR | Status: DC | PRN

## 2020-09-14 MED ORDER — CEFAZOLIN SODIUM-DEXTROSE 1-4 GM/50ML-% IV SOLN
1.0000 g | Freq: Three times a day (TID) | INTRAVENOUS | Status: AC
  Administered 2020-09-14 – 2020-09-15 (×2): 1 g via INTRAVENOUS
  Filled 2020-09-14 (×2): qty 50

## 2020-09-14 MED ORDER — FENTANYL CITRATE (PF) 100 MCG/2ML IJ SOLN
INTRAMUSCULAR | Status: DC | PRN
  Administered 2020-09-14: 50 ug via INTRAVENOUS
  Administered 2020-09-14: 150 ug via INTRAVENOUS

## 2020-09-14 MED ORDER — LOSARTAN POTASSIUM 50 MG PO TABS: 100.0000 mg | ORAL_TABLET | Freq: Every day | ORAL | Status: DC

## 2020-09-14 MED ORDER — OXYCODONE HCL 5 MG PO TABS: 5.0000 mg | ORAL_TABLET | Freq: Once | ORAL | Status: DC | PRN

## 2020-09-14 MED ORDER — CHLORHEXIDINE GLUCONATE 0.12 % MT SOLN: 15.0000 mL | Freq: Once | OROMUCOSAL | Status: AC

## 2020-09-14 MED ORDER — PROMETHAZINE HCL 25 MG/ML IJ SOLN
6.2500 mg | INTRAMUSCULAR | Status: DC | PRN
Start: 2020-09-14 — End: 2020-09-14

## 2020-09-14 MED ORDER — DEXAMETHASONE SODIUM PHOSPHATE 10 MG/ML IJ SOLN
INTRAMUSCULAR | Status: DC | PRN
  Administered 2020-09-14: 4 mg via INTRAVENOUS

## 2020-09-14 MED ORDER — AMLODIPINE BESYLATE 5 MG PO TABS: 5.0000 mg | ORAL_TABLET | Freq: Every day | ORAL | Status: DC

## 2020-09-14 MED ORDER — BUPIVACAINE HCL (PF) 0.25 % IJ SOLN
INTRAMUSCULAR | Status: DC | PRN
  Administered 2020-09-14: 10 mL

## 2020-09-14 SURGICAL SUPPLY — 49 items
BAG DECANTER FOR FLEXI CONT (MISCELLANEOUS) IMPLANT
BAND RUBBER #18 3X1/16 STRL (MISCELLANEOUS) ×2 IMPLANT
BENZOIN TINCTURE PRP APPL 2/3 (GAUZE/BANDAGES/DRESSINGS) ×2 IMPLANT
BLADE CLIPPER SURG (BLADE) IMPLANT
BUR CUTTER 7.0 ROUND (BURR) ×2 IMPLANT
CANISTER SUCT 3000ML PPV (MISCELLANEOUS) ×2 IMPLANT
CARTRIDGE OIL MAESTRO DRILL (MISCELLANEOUS) ×1 IMPLANT
COVER WAND RF STERILE (DRAPES) ×2 IMPLANT
DECANTER SPIKE VIAL GLASS SM (MISCELLANEOUS) IMPLANT
DERMABOND ADVANCED (GAUZE/BANDAGES/DRESSINGS) ×1
DERMABOND ADVANCED .7 DNX12 (GAUZE/BANDAGES/DRESSINGS) ×1 IMPLANT
DIFFUSER DRILL AIR PNEUMATIC (MISCELLANEOUS) ×2 IMPLANT
DRAPE HALF SHEET 40X57 (DRAPES) IMPLANT
DRAPE LAPAROTOMY 100X72X124 (DRAPES) ×2 IMPLANT
DRAPE MICROSCOPE LEICA (MISCELLANEOUS) ×2 IMPLANT
DRAPE SURG 17X23 STRL (DRAPES) ×4 IMPLANT
DURAPREP 26ML APPLICATOR (WOUND CARE) ×2 IMPLANT
ELECT REM PT RETURN 9FT ADLT (ELECTROSURGICAL) ×2
ELECTRODE REM PT RTRN 9FT ADLT (ELECTROSURGICAL) ×1 IMPLANT
GAUZE 4X4 16PLY RFD (DISPOSABLE) IMPLANT
GAUZE SPONGE 4X4 12PLY STRL (GAUZE/BANDAGES/DRESSINGS) ×2 IMPLANT
GAUZE SPONGE 4X4 12PLY STRL LF (GAUZE/BANDAGES/DRESSINGS) ×2 IMPLANT
GLOVE BIO SURGEON STRL SZ 6.5 (GLOVE) ×2 IMPLANT
GLOVE ECLIPSE 9.0 STRL (GLOVE) ×2 IMPLANT
GLOVE EXAM NITRILE XL STR (GLOVE) IMPLANT
GLOVE SURG UNDER POLY LF SZ6.5 (GLOVE) ×2 IMPLANT
GOWN STRL REUS W/ TWL LRG LVL3 (GOWN DISPOSABLE) IMPLANT
GOWN STRL REUS W/ TWL XL LVL3 (GOWN DISPOSABLE) ×1 IMPLANT
GOWN STRL REUS W/TWL 2XL LVL3 (GOWN DISPOSABLE) IMPLANT
GOWN STRL REUS W/TWL LRG LVL3 (GOWN DISPOSABLE)
GOWN STRL REUS W/TWL XL LVL3 (GOWN DISPOSABLE) ×1
KIT BASIN OR (CUSTOM PROCEDURE TRAY) ×2 IMPLANT
KIT TURNOVER KIT B (KITS) ×2 IMPLANT
NEEDLE 22X1 1/2 (OR ONLY) (NEEDLE) ×2 IMPLANT
NEEDLE HYPO 22GX1.5 SAFETY (NEEDLE) ×2 IMPLANT
NEEDLE SPNL 22GX3.5 QUINCKE BK (NEEDLE) IMPLANT
NS IRRIG 1000ML POUR BTL (IV SOLUTION) ×2 IMPLANT
OIL CARTRIDGE MAESTRO DRILL (MISCELLANEOUS) ×2
PACK LAMINECTOMY NEURO (CUSTOM PROCEDURE TRAY) ×2 IMPLANT
PAD ARMBOARD 7.5X6 YLW CONV (MISCELLANEOUS) ×6 IMPLANT
SPONGE SURGIFOAM ABS GEL SZ50 (HEMOSTASIS) ×2 IMPLANT
STRIP CLOSURE SKIN 1/2X4 (GAUZE/BANDAGES/DRESSINGS) ×2 IMPLANT
SUT VIC AB 2-0 CT1 18 (SUTURE) ×2 IMPLANT
SUT VIC AB 3-0 SH 8-18 (SUTURE) ×2 IMPLANT
SYR CONTROL 10ML LL (SYRINGE) ×2 IMPLANT
TAPE CLOTH 4X10 WHT NS (GAUZE/BANDAGES/DRESSINGS) ×2 IMPLANT
TOWEL GREEN STERILE (TOWEL DISPOSABLE) ×2 IMPLANT
TOWEL GREEN STERILE FF (TOWEL DISPOSABLE) ×2 IMPLANT
WATER STERILE IRR 1000ML POUR (IV SOLUTION) ×2 IMPLANT

## 2020-09-14 NOTE — Brief Op Note (Signed)
09/14/2020  3:49 PM  PATIENT:  Jerry [name]  45 y.o. male  PRE-OPERATIVE DIAGNOSIS:  HNP  POST-OPERATIVE DIAGNOSIS:  HNP  PROCEDURE:  Procedure(s) with comments: Microdiscectomy - right - L4-L5 (Right) - 3C  SURGEON:  Surgeon(s) and Role:    * Julio Sicks, MD - Primary  PHYSICIAN ASSISTANT:   ASSISTANTSMarland Mcalpine   ANESTHESIA:   general  EBL:  100cc   BLOOD ADMINISTERED:none  DRAINS: none   LOCAL MEDICATIONS USED:  MARCAINE     SPECIMEN:  No Specimen  DISPOSITION OF SPECIMEN:  N/A  COUNTS:  YES  TOURNIQUET:  * No tourniquets in log *  DICTATION: .Dragon Dictation  PLAN OF CARE: Admit for overnight observation  PATIENT DISPOSITION:  PACU - hemodynamically stable.   Delay start of Pharmacological VTE agent (>24hrs) due to surgical blood loss or risk of bleeding: yes

## 2020-09-14 NOTE — Transfer of Care (Signed)
Immediate Anesthesia Transfer of Care Note  Patient: Jerry [name]  Procedure(s) Performed: Microdiscectomy - right - L4-L5 (Right Back)  Patient Location: PACU  Anesthesia Type:General  Level of Consciousness: awake, alert , oriented and patient cooperative  Airway & Oxygen Therapy: Patient Spontanous Breathing and Patient connected to nasal cannula oxygen  Post-op Assessment: Report given to RN and Post -op Vital signs reviewed and stable  Post vital signs: Reviewed and stable  Last Vitals:  Vitals Value Taken Time  BP    Temp    Pulse 86 09/14/20 1609  Resp 19 09/14/20 1609  SpO2 100 % 09/14/20 1609  Vitals shown include unvalidated device data.  Last Pain:  Vitals:   09/14/20 1155  TempSrc:   PainSc: 0-No pain         Complications: No complications documented.

## 2020-09-14 NOTE — Anesthesia Preprocedure Evaluation (Addendum)
Anesthesia Evaluation  Patient identified by MRN, date of birth, ID band Patient awake    Reviewed: Allergy & Precautions, NPO status , Patient's Chart, lab work & pertinent test results  Airway Mallampati: II  TM Distance: >3 FB Neck ROM: Full    Dental no notable dental hx.    Pulmonary former smoker,    Pulmonary exam normal breath sounds clear to auscultation       Cardiovascular hypertension, Pt. on medications Normal cardiovascular exam Rhythm:Regular Rate:Normal  ECG: rate 76. Normal sinus rhythm. Possible Left atrial enlargement. Left anterior fascicular block   Neuro/Psych negative neurological ROS  negative psych ROS   GI/Hepatic Neg liver ROS, GERD  Medicated and Controlled,  Endo/Other  Morbid obesity  Renal/GU negative Renal ROS     Musculoskeletal negative musculoskeletal ROS (+) Chronic back pain   Abdominal (+) + obese,   Peds  Hematology negative hematology ROS (+)   Anesthesia Other Findings HNP  Reproductive/Obstetrics                            Anesthesia Physical Anesthesia Plan  ASA: III  Anesthesia Plan: General   Post-op Pain Management:    Induction: Intravenous  PONV Risk Score and Plan: 2 and Ondansetron, Dexamethasone, Midazolam and Treatment may vary due to age or medical condition  Airway Management Planned: Oral ETT  Additional Equipment:   Intra-op Plan:   Post-operative Plan: Extubation in OR  Informed Consent: I have reviewed the patients History and Physical, chart, labs and discussed the procedure including the risks, benefits and alternatives for the proposed anesthesia with the patient or authorized representative who has indicated his/her understanding and acceptance.     Dental advisory given  Plan Discussed with: CRNA  Anesthesia Plan Comments:        Anesthesia Quick Evaluation

## 2020-09-14 NOTE — Anesthesia Postprocedure Evaluation (Signed)
Anesthesia Post Note  Patient: Jerry [name]  Procedure(s) Performed: Microdiscectomy - right - L4-L5 (Right Back)     Patient location during evaluation: PACU Anesthesia Type: General Level of consciousness: awake Pain management: pain level controlled Vital Signs Assessment: post-procedure vital signs reviewed and stable Respiratory status: spontaneous breathing, nonlabored ventilation, respiratory function stable and patient connected to nasal cannula oxygen Cardiovascular status: blood pressure returned to baseline and stable Postop Assessment: no apparent nausea or vomiting Anesthetic complications: no   No complications documented.  Last Vitals:  Vitals:   09/14/20 1725 09/14/20 1949  BP: (!) 145/80 (!) 147/79  Pulse: 71 96  Resp: 18 20  Temp: 36.5 C 36.5 C  SpO2: 100% 98%    Last Pain:  Vitals:   09/14/20 2023  TempSrc:   PainSc: 7                  Rosenda Geffrard P Audrinna Sherman

## 2020-09-14 NOTE — Anesthesia Procedure Notes (Signed)
Procedure Name: Intubation Date/Time: 09/14/2020 2:33 PM Performed by: Oletta Lamas, CRNA Pre-anesthesia Checklist: Patient identified, Emergency Drugs available, Suction available and Patient being monitored Patient Re-evaluated:Patient Re-evaluated prior to induction Oxygen Delivery Method: Circle System Utilized Preoxygenation: Pre-oxygenation with 100% oxygen Induction Type: IV induction Ventilation: Mask ventilation without difficulty Laryngoscope Size: Mac and 4 Grade View: Grade I Tube type: Oral Tube size: 7.5 mm Number of attempts: 1 Airway Equipment and Method: Stylet and Oral airway Placement Confirmation: ETT inserted through vocal cords under direct vision,  positive ETCO2 and breath sounds checked- equal and bilateral Secured at: 24 cm Tube secured with: Tape Dental Injury: Teeth and Oropharynx as per pre-operative assessment

## 2020-09-14 NOTE — H&P (Signed)
Jerry [name] is an 45 y.o. male.   Chief Complaint: Right lower extremity pain HPI: 45 year old male with lower back pain with radiation to his right lower extremity.  Symptoms aggravated by prolonged standing or walking.  Symptoms of failed conservative management.  Symptoms not associated any weakness but does have some persistent numbness and paresthesias.  Work-up demonstrates evidence of a right-sided L4-5 disc herniation with associated facet arthropathy and marked lateral recess stenosis with compression of the thecal sac and right L5 nerve root.  Patient presents now for decompressive laminotomy and microdiscectomy in hopes of improving his symptoms.  Past Medical History:  Diagnosis Date  . Chronic back pain   . Hypertension     Past Surgical History:  Procedure Laterality Date  . ACHILLES TENDON SURGERY    . TOE SURGERY    . TONSILLECTOMY      Family History  Problem Relation Age of Onset  . Heart attack Maternal Grandmother   . Heart attack Paternal Grandmother    Social History:  reports that he has quit smoking. His smoking use included cigarettes. He quit after 1.00 year of use. He has never used smokeless tobacco. He reports that he does not drink alcohol and does not use drugs.  Allergies:  Allergies  Allergen Reactions  . Gabapentin Other (See Comments)    Made impotent     Medications Prior to Admission  Medication Sig Dispense Refill  . albuterol (VENTOLIN HFA) 108 (90 Base) MCG/ACT inhaler Inhale 1-2 puffs into the lungs every 6 (six) hours as needed for wheezing or shortness of breath. 8 g 0  . amLODipine (NORVASC) 5 MG tablet Take 5 mg by mouth daily.    . cetirizine (ZYRTEC ALLERGY) 10 MG tablet Take 1 tablet (10 mg total) by mouth daily. (Patient taking differently: Take 10 mg by mouth daily as needed for allergies.) 30 tablet 0  . dextromethorphan-guaiFENesin (MUCINEX DM) 30-600 MG 12hr tablet Take 1 tablet by mouth 2 (two) times daily. (Patient  taking differently: Take 1 tablet by mouth 2 (two) times daily as needed for cough.) 20 tablet 0  . diclofenac (VOLTAREN) 50 MG EC tablet Take 50 mg by mouth 2 (two) times daily as needed for pain.    Marland Kitchen diclofenac Sodium (VOLTAREN) 1 % GEL Apply 1 application topically daily as needed for pain.    . fluticasone (FLONASE) 50 MCG/ACT nasal spray Place 1 spray into both nostrils daily. (Patient taking differently: Place 1 spray into both nostrils daily as needed for allergies or rhinitis.) 16 g 0  . hydrALAZINE (APRESOLINE) 100 MG tablet Take 100 mg by mouth 2 (two) times daily.     Marland Kitchen losartan (COZAAR) 100 MG tablet Take 100 mg by mouth daily.    . naproxen (NAPROSYN) 500 MG tablet Take 1 tablet (500 mg total) by mouth 2 (two) times daily. (Patient taking differently: Take 500 mg by mouth 2 (two) times daily as needed for mild pain.) 30 tablet 0  . pantoprazole (PROTONIX) 40 MG tablet Take 40 mg by mouth daily.    Marland Kitchen spironolactone (ALDACTONE) 50 MG tablet TAKE 1 TABLET(50 MG) BY MOUTH DAILY (Patient taking differently: Take 50 mg by mouth daily.) 90 tablet 3  . tamsulosin (FLOMAX) 0.4 MG CAPS capsule Take 0.4 mg by mouth daily.    . predniSONE (DELTASONE) 10 MG tablet Begin with 6 tabs on day 1, 5 tab on day 2, 4 tab on day 3, 3 tab on day 4, 2 tab  on day 5, 1 tab on day 6-take with food (Patient not taking: Reported on 09/10/2020) 21 tablet 0    No results found for this or any previous visit (from the past 48 hour(s)). No results found.  Pertinent items noted in HPI and remainder of comprehensive ROS otherwise negative.  There were no vitals taken for this visit.  Patient is awake and alert.  He is oriented and appropriate.  Speech is fluent.  Judgment insight are intact.  Cranial nerve function normal bilateral motor examination reveals some minimal weakness of dorsiflexion in his right foot and extensor houses longus otherwise motor strength intact.  Sensory examination reveals some mild decreased  sensation pinprick and light touch in his right L5 dermatome.  Deep tender versus normal active.  No evidence of long track signs.  Gait antalgic.  Posture normal peer examination head ears eyes nose throat is unremarked.  Chest and abdomen are benign.  Extremities are free from injury or deformity. 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  Plan right L4-5 laminotomy and microdiscectomy.  Risks and benefits been explained.  Patient wishes to proceed.  Jerry [name] 09/14/2020, 11:29 AM

## 2020-09-14 NOTE — Op Note (Signed)
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  Asst. Surgeon: Doran Durand, NP  Anesthesia: General  Indication: 45 year old male with right lower extremity pain paresthesias and some mild weakness consistent with a right-sided L5 radiculopathy which is failed conservative management her work-up demonstrates evidence of significant degeneration with marked facet arthropathy and associated rightward L4-5 disc herniation with severe compression of thecal sac and right L5 nerve root.  Patient presents now for decompressive surgery in hopes of improving his symptoms.  Operative note: After induction of anesthesia, patient position prone on the Wilson frame and properly padded.  Lumbar region prepped and draped sterilely.  Incision made overlying L4-5.  Dissection performed on the right.  Retractor placed.  Fluoroscopy used.  Levels confirmed.  Decompressive laminotomy and foraminotomies then performed using Kerrison rongeurs and high-speed drill to remove the inferior two thirds of the lamina of L4 the medial aspect of the L4-5 facet joint and the superior rim of the L5 lamina.  Ligament flavum elevated and resected.  Underlying thecal sac and right L5 nerve root identified.  Microscope brought in field is microdissection spinal canal.  Epidural venous plexus was coagulated and cut.  Thecal sac and L5 nerve root gently mobilized retract towards midline.  Disc space and disc herniation identified.  Disc space was then incised with a 15 blade.  Discectomy then performed using pituitary rongeurs up angle to ureters and Epstein curettes.  All loose or obviously degenerative disc tear was moved in  interspace.  There was no evidence of any residual compression.  There was no evidence of any injury to the thecal sac or nerve roots.  Wound is then irrigated one final time.  Gelfoam was placed over the laminotomy divided.  Wounds and closed in layers with Vicryl sutures.  Steri-Strips and sterile dressing were applied.  No apparent complications.  Patient tolerated the procedure well and he returns to recovery room postop.

## 2020-09-15 ENCOUNTER — Encounter (HOSPITAL_COMMUNITY): Payer: Self-pay | Admitting: Neurosurgery

## 2020-09-15 ENCOUNTER — Ambulatory Visit: Payer: 59

## 2020-09-15 DIAGNOSIS — M5116 Intervertebral disc disorders with radiculopathy, lumbar region: Secondary | ICD-10-CM | POA: Diagnosis not present

## 2020-09-15 MED ORDER — HYDROCODONE-ACETAMINOPHEN 5-325 MG PO TABS: 1.0000 | ORAL_TABLET | ORAL | 0 refills | Status: DC | PRN

## 2020-09-15 MED ORDER — CYCLOBENZAPRINE HCL 10 MG PO TABS: 10.0000 mg | ORAL_TABLET | Freq: Three times a day (TID) | ORAL | 0 refills | Status: DC | PRN

## 2020-09-15 NOTE — Discharge Instructions (Signed)

## 2020-09-15 NOTE — Evaluation (Signed)
Occupational Therapy Evaluation Patient Details Name: Jerry [name] MRN: [id number] DOB: Apr 08, 1976 Today's Date: 09/15/2020    History of Present Illness  45 yo male s/p R L4-5 decompression and microdiscectomy. PMH including HTN and chronic back pain.   Clinical Impression   PTA, pt was living with his wife and mother-in-law and was independent. Currently, pt performing ADLs and functional mobility at Mod I level. Provided education and handout on back precautions, bed mobility, grooming, LB ADLs with AE, toileting (demo AE), and shower transfer; pt demonstrated understanding. Answered all pt questions. Recommend dc home once medically stable per physician. All acute OT needs met and will sign off. Thank you.    Follow Up Recommendations  No OT follow up    Equipment Recommendations  None recommended by OT    Recommendations for Other Services PT consult (Stair training)     Precautions / Restrictions Precautions Precautions: Back Precaution Booklet Issued: Yes (comment) Precaution Comments: Reviewed back precautions Required Braces or Orthoses: Other Brace Other Brace: NO brace per MD order      Mobility Bed Mobility Overal bed mobility: Modified Independent             General bed mobility comments: Pt using log roll technique for bed mobility    Transfers Overall transfer level: Modified independent               General transfer comment: increased time    Balance Overall balance assessment: No apparent balance deficits (not formally assessed)                                         ADL either performed or assessed with clinical judgement   ADL Overall ADL's : Modified independent                                       General ADL Comments: Pt performing ADLs and functional mobility at Mod I level with increased time. Providing education on use of AE for LB ADLs. Pt demonstrating understanding.     Vision          Perception     Praxis      Pertinent Vitals/Pain Pain Assessment: Faces Faces Pain Scale: Hurts little more Pain Location: Back Pain Descriptors / Indicators: Sore Pain Intervention(s): Monitored during session;Repositioned     Hand Dominance Right   Extremity/Trunk Assessment Upper Extremity Assessment Upper Extremity Assessment: Overall WFL for tasks assessed   Lower Extremity Assessment Lower Extremity Assessment: Overall WFL for tasks assessed   Cervical / Trunk Assessment Cervical / Trunk Assessment: Other exceptions Cervical / Trunk Exceptions: Lumbar sx   Communication Communication Communication: No difficulties   Cognition Arousal/Alertness: Awake/alert Behavior During Therapy: WFL for tasks assessed/performed Overall Cognitive Status: Within Functional Limits for tasks assessed                                     General Comments       Exercises     Shoulder Instructions      Home Living Family/patient expects to be discharged to:: Private residence Living Arrangements: Spouse/significant other;Other relatives (Wife and mother-in-law) Available Help at Discharge: Family;Available 24 hours/day Type of Home: House Home Access: Stairs to  enter Entrance Stairs-Number of Steps: 5 Entrance Stairs-Rails: Can reach both ("not good ones") Home Layout: One level     Bathroom Shower/Tub: Occupational psychologist: Standard     Home Equipment: None          Prior Functioning/Environment Level of Independence: Independent        Comments: works        OT Problem List: Decreased strength;Decreased activity tolerance;Decreased range of motion;Decreased knowledge of use of DME or AE;Decreased knowledge of precautions;Pain;Obesity      OT Treatment/Interventions:      OT Goals(Current goals can be found in the care plan section) Acute Rehab OT Goals Patient Stated Goal: Get back to normal OT Goal Formulation: All  assessment and education complete, DC therapy  OT Frequency:     Barriers to D/C:            Co-evaluation              AM-PAC OT "6 Clicks" Daily Activity     Outcome Measure Help from another person eating meals?: None Help from another person taking care of personal grooming?: None Help from another person toileting, which includes using toliet, bedpan, or urinal?: None Help from another person bathing (including washing, rinsing, drying)?: None Help from another person to put on and taking off regular upper body clothing?: None Help from another person to put on and taking off regular lower body clothing?: None 6 Click Score: 24   End of Session Nurse Communication: Mobility status  Activity Tolerance: Patient tolerated treatment well Patient left: in chair;with call bell/phone within reach  OT Visit Diagnosis: Unsteadiness on feet (R26.81);Muscle weakness (generalized) (M62.81);Other abnormalities of gait and mobility (R26.89);Pain Pain - part of body:  (Back)                Time: 2111-5520 OT Time Calculation (min): 27 min Charges:  OT General Charges $OT Visit: 1 Visit OT Evaluation $OT Eval Low Complexity: 1 Low OT Treatments $Self Care/Home Management : 8-22 mins  Macaria Bias MSOT, OTR/L Acute Rehab Pager: [phone] Office: [place] 09/15/2020, 8:56 AM

## 2020-09-15 NOTE — Discharge Summary (Signed)
Physician Discharge Summary  Patient ID: JAGGER DEMONTE MRN: [id number] DOB/AGE: 46-Aug-1977 46 y.o.  Admit date: 09/14/2020 Discharge date: 09/15/2020  Admission Diagnoses:  Discharge Diagnoses:  Active Problems:   Lumbar radiculopathy   Discharged Condition: good  Hospital Course: Patient into the hospital where underwent uncomplicated right-sided L4-5 laminotomy and microdiscectomy.  Postoperatively doing very well.  Her preoperative back and right lower extreme pain much improved.  Standing walking and voiding without difficulty.  Ready for discharge home.  Consults:   Significant Diagnostic Studies:   Treatments:   Discharge Exam: Blood pressure 118/63, pulse 76, temperature 98.1 F (36.7 C), temperature source Oral, resp. rate 18, height 5\' 7"  (1.702 m), weight 130 kg, SpO2 93 %. Awake and alert.  Oriented and appropriate.  Motor and sensory function intact.  Wound clean and dry.  Chest and abdomen benign.  Disposition: Discharge disposition: 01-Home or Self Care        Allergies as of 09/15/2020      Reactions   Gabapentin Other (See Comments)   Made impotent       Medication List    STOP taking these medications   predniSONE 10 MG tablet Commonly known as: DELTASONE     TAKE these medications   albuterol 108 (90 Base) MCG/ACT inhaler Commonly known as: VENTOLIN HFA Inhale 1-2 puffs into the lungs every 6 (six) hours as needed for wheezing or shortness of breath.   amLODipine 5 MG tablet Commonly known as: NORVASC Take 5 mg by mouth daily.   cetirizine 10 MG tablet Commonly known as: ZyrTEC Allergy Take 1 tablet (10 mg total) by mouth daily. What changed:   when to take this  reasons to take this   cyclobenzaprine 10 MG tablet Commonly known as: FLEXERIL Take 1 tablet (10 mg total) by mouth 3 (three) times daily as needed for muscle spasms.   dextromethorphan-guaiFENesin 30-600 MG 12hr tablet Commonly known as: MUCINEX DM Take 1 tablet by  mouth 2 (two) times daily. What changed:   when to take this  reasons to take this   diclofenac 50 MG EC tablet Commonly known as: VOLTAREN Take 50 mg by mouth 2 (two) times daily as needed for pain.   diclofenac Sodium 1 % Gel Commonly known as: VOLTAREN Apply 1 application topically daily as needed for pain.   fluticasone 50 MCG/ACT nasal spray Commonly known as: FLONASE Place 1 spray into both nostrils daily. What changed:   when to take this  reasons to take this   hydrALAZINE 100 MG tablet Commonly known as: APRESOLINE Take 100 mg by mouth 2 (two) times daily.   HYDROcodone-acetaminophen 5-325 MG tablet Commonly known as: NORCO/VICODIN Take 1 tablet by mouth every 4 (four) hours as needed for moderate pain ((score 4 to 6)).   losartan 100 MG tablet Commonly known as: COZAAR Take 100 mg by mouth daily.   naproxen 500 MG tablet Commonly known as: NAPROSYN Take 1 tablet (500 mg total) by mouth 2 (two) times daily. What changed:   when to take this  reasons to take this   pantoprazole 40 MG tablet Commonly known as: PROTONIX Take 40 mg by mouth daily.   spironolactone 50 MG tablet Commonly known as: ALDACTONE TAKE 1 TABLET(50 MG) BY MOUTH DAILY What changed: See the new instructions.   tamsulosin 0.4 MG Caps capsule Commonly known as: FLOMAX Take 0.4 mg by mouth daily.        Signed: 11/15/2020 Qunicy Higinbotham 09/15/2020, 10:14 AM

## 2020-09-15 NOTE — Evaluation (Signed)
Physical Therapy Evaluation Patient Details Name: Jerry [name] MRN: [id number] DOB: 06-16-1976 Today's Date: 09/15/2020   History of Present Illness  45 yo male s/p R L4-5 decompression and microdiscectomy 09/14/20. PMH including HTN and chronic back pain.  Clinical Impression  Patient presents with pain and post surgical deficits s/p above surgery. Pt reports being independent for ADLs/IADLs and working PTA. Today, pt tolerated transfers, gait training and stair training with supervision-Mod I for safety.  Reviewed back precautions especially relating to work duties, exercise etc. Pt eager to eventually get back to power lifting in the future. Would likely benefit from use of [facility]- educated pt on how to gait train using DME. Pt asking about a brace- defer to MD. Pt has necessary support at home. Pt does not require skilled therapy services as pt functioning at Mod I-supervision level. All education completed. Discharge from therapy.    Follow Up Recommendations No PT follow up;Supervision - Intermittent    Equipment Recommendations  Gilmer Mor (pt to get on his own)    Recommendations for Other Services       Precautions / Restrictions Precautions Precautions: Back Precaution Booklet Issued: Yes (comment) Precaution Comments: Reviewed back precautions Required Braces or Orthoses: Other Brace Other Brace: NO brace per MD order Restrictions Weight Bearing Restrictions: No      Mobility  Bed Mobility Overal bed mobility: Modified Independent             General bed mobility comments: UP in chair upon PT arrival. verbally reviewed log roll technique.    Transfers Overall transfer level: Modified independent Equipment used: None             General transfer comment: Stood from chair x1, good demo of hand placement.  Ambulation/Gait Ambulation/Gait assistance: Modified independent (Device/Increase time) Gait Distance (Feet): 200 Feet Assistive device: None Gait  Pattern/deviations: Step-through pattern;Decreased stride length;Decreased stance time - right;Antalgic;Wide base of support Gait velocity: decreased   General Gait Details: Slow, antalgic like gait with decreased stance time RLE; would benefit from a SPC. Discussed proper tchnique using DME.  Stairs Stairs: Yes Stairs assistance: Supervision Stair Management: One rail Left;Alternating pattern;Step to pattern Number of Stairs: 5 General stair comments: Cues for technique/safety.  Wheelchair Mobility    Modified Rankin (Stroke Patients Only)       Balance Overall balance assessment: Needs assistance Sitting-balance support: Feet supported;No upper extremity supported Sitting balance-Leahy Scale: Good     Standing balance support: During functional activity Standing balance-Leahy Scale: Fair Standing balance comment: ABle to reach down and adjust pant leg without difficulty.                             Pertinent Vitals/Pain Pain Assessment: Faces Faces Pain Scale: Hurts little more Pain Location: Back Pain Descriptors / Indicators: Sore;Operative site guarding Pain Intervention(s): Monitored during session;Repositioned    Home Living Family/patient expects to be discharged to:: Private residence Living Arrangements: Spouse/significant other;Other relatives Available Help at Discharge: Family;Available 24 hours/day Type of Home: House Home Access: Stairs to enter Entrance Stairs-Rails: Can reach both Entrance Stairs-Number of Steps: 5 Home Layout: One level Home Equipment: None      Prior Function Level of Independence: Independent         Comments: works, Advice worker, Stage manager Dominance   Dominant Hand: Right    Extremity/Trunk Assessment   Upper Extremity Assessment Upper Extremity Assessment: Defer to OT evaluation  Lower Extremity Assessment Lower Extremity Assessment: RLE deficits/detail RLE Deficits / Details: Grossly  ~4/5 throughout    Cervical / Trunk Assessment Cervical / Trunk Assessment: Other exceptions Cervical / Trunk Exceptions: Lumbar sx  Communication   Communication: No difficulties  Cognition Arousal/Alertness: Awake/alert Behavior During Therapy: WFL for tasks assessed/performed Overall Cognitive Status: Within Functional Limits for tasks assessed                                        General Comments General comments (skin integrity, edema, etc.): Pt asking questions relating to job duties, lifting heavy crates, working out, Catering manager.    Exercises     Assessment/Plan    PT Assessment Patent does not need any further PT services  PT Problem List         PT Treatment Interventions      PT Goals (Current goals can be found in the Care Plan section)  Acute Rehab PT Goals Patient Stated Goal: to get back to power lifting, work, independence PT Goal Formulation: With patient Time For Goal Achievement: 09/29/20 Potential to Achieve Goals: Good    Frequency     Barriers to discharge        Co-evaluation               AM-PAC PT "6 Clicks" Mobility  Outcome Measure Help needed turning from your back to your side while in a flat bed without using bedrails?: None Help needed moving from lying on your back to sitting on the side of a flat bed without using bedrails?: None Help needed moving to and from a bed to a chair (including a wheelchair)?: A Little Help needed standing up from a chair using your arms (e.g., wheelchair or bedside chair)?: A Little Help needed to walk in hospital room?: A Little Help needed climbing 3-5 steps with a railing? : A Little 6 Click Score: 20    End of Session   Activity Tolerance: Patient tolerated treatment well Patient left: in chair;with call bell/phone within reach Nurse Communication: Mobility status PT Visit Diagnosis: Pain;Unsteadiness on feet (R26.81) Pain - part of body:  (back)    Time: 5852-7782 PT Time  Calculation (min) (ACUTE ONLY): 21 min   Charges:   PT Evaluation $PT Eval Moderate Complexity: 1 Mod          Vale Haven, PT, DPT Acute Rehabilitation Services Pager [phone] Office [phone]      Blake Divine A Alyxandria Wentz 09/15/2020, 11:25 AM

## 2020-09-15 NOTE — Progress Notes (Signed)
Pt doing well. Pt given D/C instructions with verbal understanding. Rx's were sent to the pharmacy by MD. Pt's incision is clean and dry with no sign of infection. Pt's IV was removed prior to D/C. Pt D/C'd home via wheelchair per MD order. Pt is stable @ D/C and has no other needs at this time. Trevionne Advani, RN  

## 2021-03-05 ENCOUNTER — Other Ambulatory Visit: Payer: Self-pay | Admitting: Cardiology

## 2021-03-05 DIAGNOSIS — I10 Essential (primary) hypertension: Secondary | ICD-10-CM

## 2021-03-05 DIAGNOSIS — R600 Localized edema: Secondary | ICD-10-CM

## 2021-04-22 IMAGING — DX DG CHEST 2V
2 series · 2 of 2 positions shown · non-contrast
Comparison: None.

CLINICAL DATA: Chest pain, shortness of breath

EXAM:
CHEST - 2 VIEW

[chest pa]
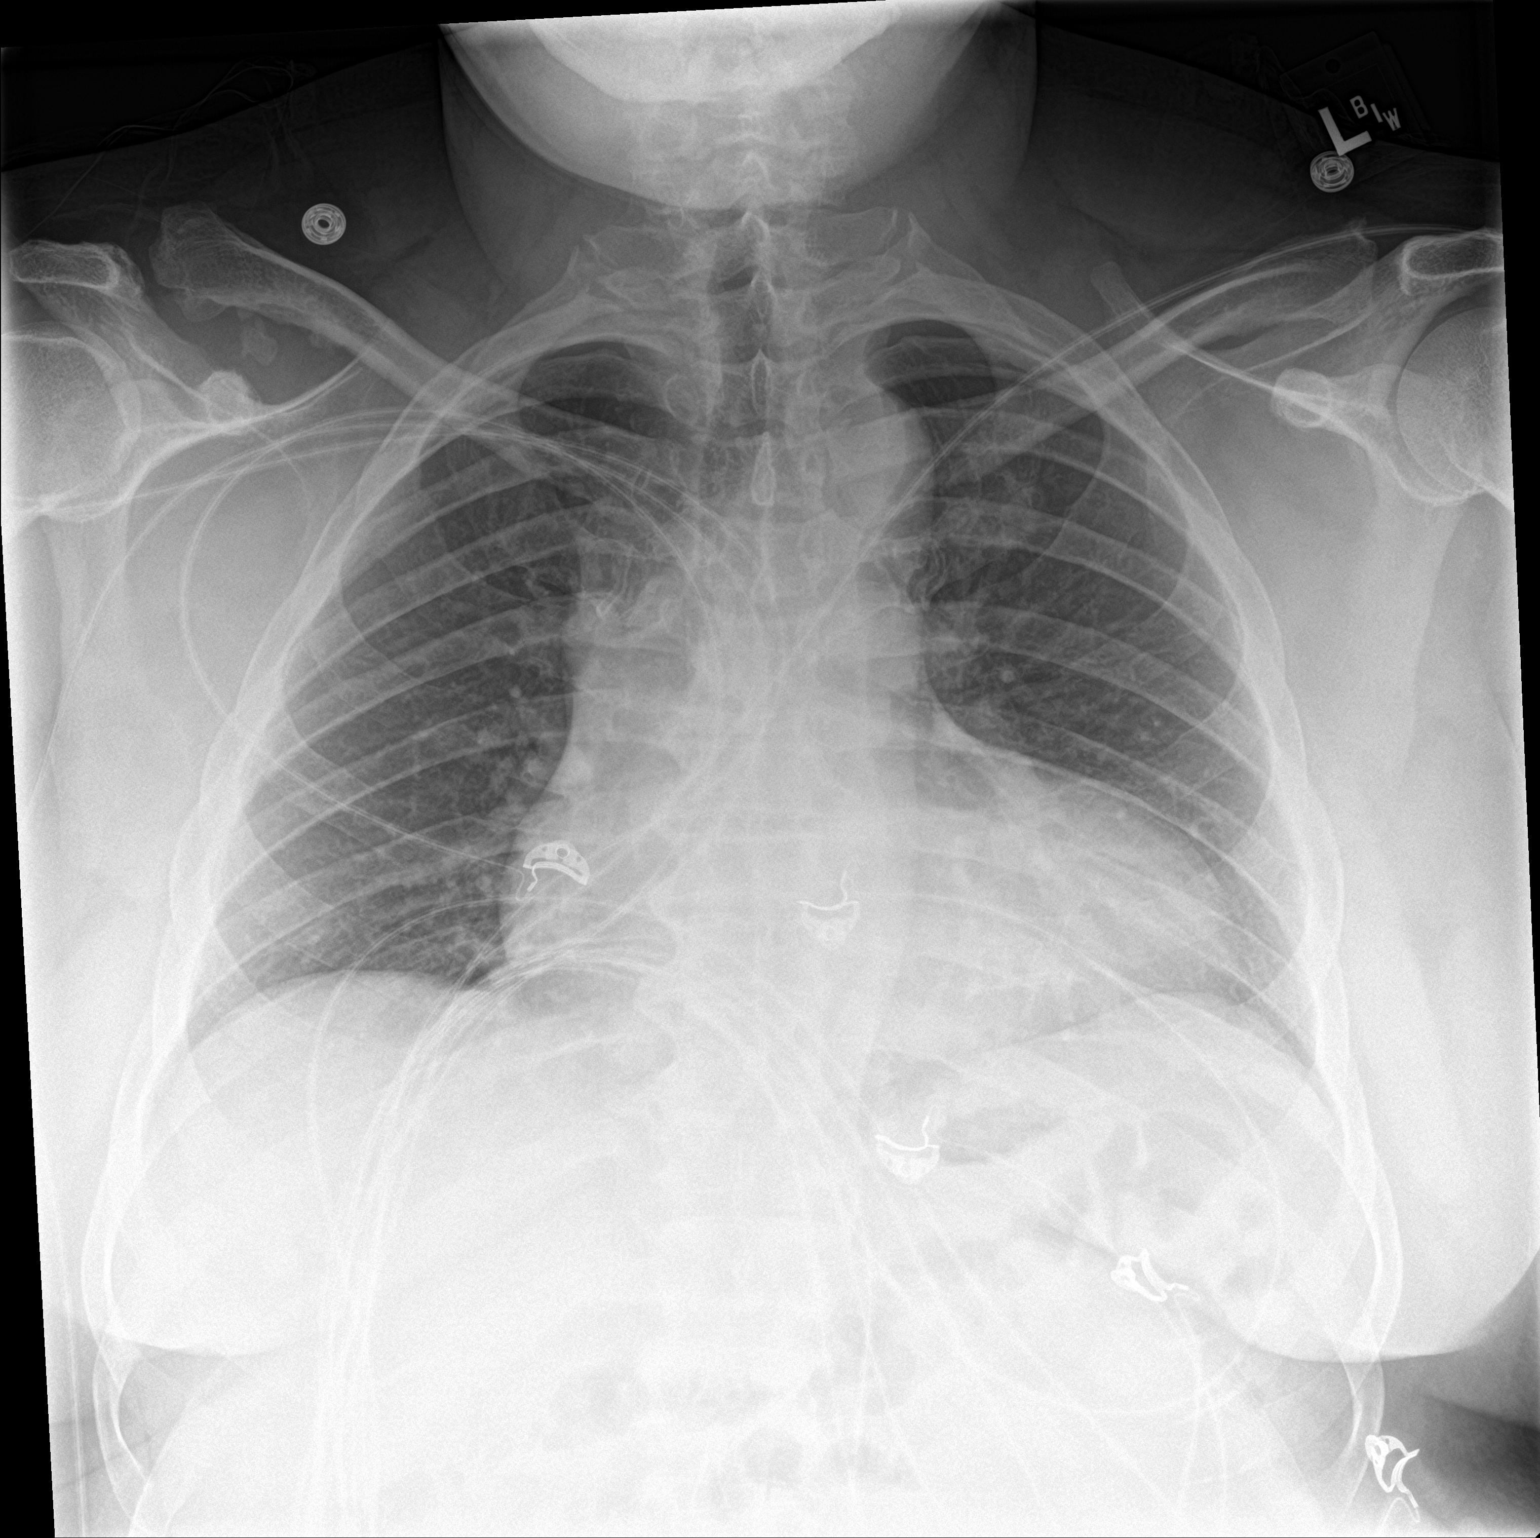

[chest lat]
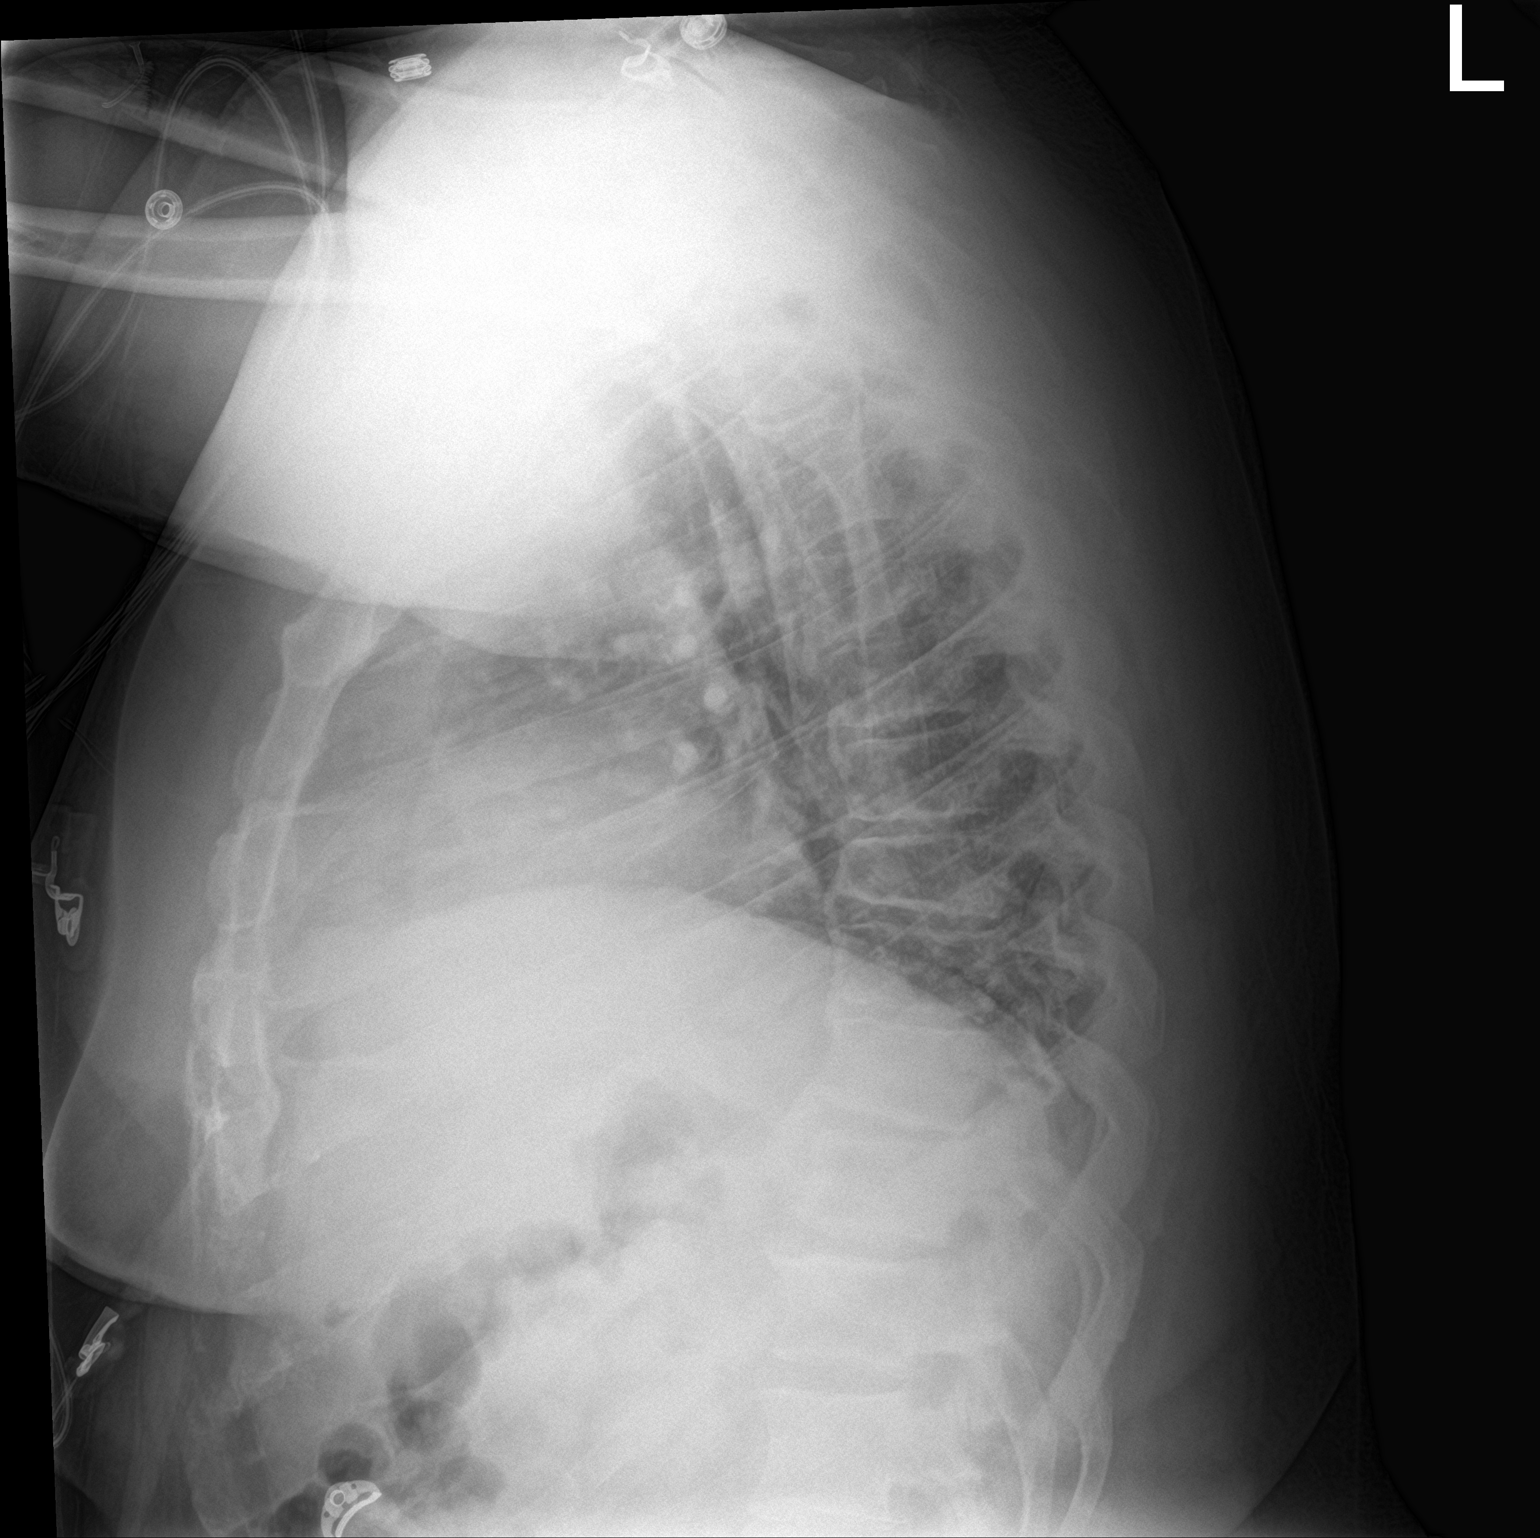

[2 of 2 positions shown; findings below may reference images not displayed]

FINDINGS: Cardiomegaly. Both lungs are clear. Disc degenerative disease of the
thoracic spine.
IMPRESSION: Cardiomegaly without acute abnormality of the lungs.

## 2021-06-14 ENCOUNTER — Encounter: Payer: Self-pay | Admitting: Emergency Medicine

## 2021-06-14 ENCOUNTER — Ambulatory Visit
Admission: EM | Admit: 2021-06-14 | Discharge: 2021-06-14 | Disposition: A | Discharge status: Home / Self Care | Payer: 59 | Attending: Physician Assistant | Admitting: Physician Assistant

## 2021-06-14 ENCOUNTER — Other Ambulatory Visit: Payer: Self-pay

## 2021-06-14 DIAGNOSIS — B351 Tinea unguium: Secondary | ICD-10-CM | POA: Diagnosis not present

## 2021-06-14 DIAGNOSIS — H65191 Other acute nonsuppurative otitis media, right ear: Secondary | ICD-10-CM | POA: Diagnosis not present

## 2021-06-14 MED ORDER — CICLOPIROX OLAMINE 0.77 % EX SUSP: 1.0000 "application " | Freq: Two times a day (BID) | CUTANEOUS | 0 refills | Status: AC

## 2021-06-14 MED ORDER — AMOXICILLIN 500 MG PO CAPS: 500.0000 mg | ORAL_CAPSULE | Freq: Three times a day (TID) | ORAL | 0 refills | Status: DC

## 2021-06-14 NOTE — ED Triage Notes (Addendum)
Right ear pain since yesterday, also concerned about toe fungi. Noticed a tickle in his ear 3 nights ago, put hydrogen peroxide in it. Put more peroxide in it yesterday. C/o itching in ear

## 2021-06-14 NOTE — ED Provider Notes (Signed)
[facility]    CSN: [id number] Arrival date & time: 06/14/21  1633      History   Chief Complaint Chief Complaint  Patient presents with   Otalgia    HPI Jerry [name] is a 45 y.o. male.   Patient here today for evaluation of right ear pain that started a few days ago. He reports some "tickle" in his ear as well. He has not had any fever. He denies significant nasal congestion or drainage. He has tried using peroxide without significant relief.   He also report a white discoloration to his right great toe that he first noted about a month ago. He thinks it might be fungal in nature. He has not had any treatment for symptoms.   The history is provided by the patient.  Otalgia Associated symptoms: no abdominal pain, no congestion, no cough, no fever, no sore throat and no vomiting    Past Medical History:  Diagnosis Date   Chronic back pain    Hypertension     Patient Active Problem List   Diagnosis Date Noted   Lumbar radiculopathy 09/14/2020   Leg edema 05/24/2019   Diabetes mellitus (HCC) 04/19/2019   Gastroesophageal reflux disease without esophagitis 08/12/2018   Obesity, morbid, BMI 50 or higher (HCC) 09/15/2014   Essential hypertension 10/20/2008    Past Surgical History:  Procedure Laterality Date   ACHILLES TENDON SURGERY     LUMBAR LAMINECTOMY/DECOMPRESSION MICRODISCECTOMY Right 09/14/2020   Procedure: Microdiscectomy - right - L4-L5;  Surgeon: Julio Sicks, MD;  Location: [facility] OR;  Service: Neurosurgery;  Laterality: Right;  3C   TOE SURGERY     TONSILLECTOMY         Home Medications    Prior to Admission medications   Medication Sig Start Date End Date Taking? Authorizing Provider  amoxicillin (AMOXIL) 500 MG capsule Take 1 capsule (500 mg total) by mouth 3 (three) times daily. 06/14/21  Yes Tomi Bamberger, PA-C  ciclopirox (LOPROX) 0.77 % SUSP Apply 1 application topically 2 (two) times daily for 14 days. 06/14/21 06/28/21 Yes Tomi Bamberger, PA-C  albuterol (VENTOLIN HFA) 108 (90 Base) MCG/ACT inhaler Inhale 1-2 puffs into the lungs every 6 (six) hours as needed for wheezing or shortness of breath. 08/25/20   Wieters, Hallie C, PA-C  amLODipine (NORVASC) 5 MG tablet Take 5 mg by mouth daily.    [provider]  cetirizine (ZYRTEC ALLERGY) 10 MG tablet Take 1 tablet (10 mg total) by mouth daily. Patient taking differently: Take 10 mg by mouth daily as needed for allergies. 07/11/20   Hall-Potvin, [place], PA-C  cyclobenzaprine (FLEXERIL) 10 MG tablet Take 1 tablet (10 mg total) by mouth 3 (three) times daily as needed for muscle spasms. 09/15/20   Julio Sicks, MD  dextromethorphan-guaiFENesin [facility] DM) 30-600 MG 12hr tablet Take 1 tablet by mouth 2 (two) times daily. Patient taking differently: Take 1 tablet by mouth 2 (two) times daily as needed for cough. 08/25/20   Wieters, Hallie C, PA-C  diclofenac (VOLTAREN) 50 MG EC tablet Take 50 mg by mouth 2 (two) times daily as needed for pain. 08/05/20   [provider]  diclofenac Sodium (VOLTAREN) 1 % GEL Apply 1 application topically daily as needed for pain. 08/05/20   [provider]  fluticasone (FLONASE) 50 MCG/ACT nasal spray Place 1 spray into both nostrils daily. Patient taking differently: Place 1 spray into both nostrils daily as needed for allergies or rhinitis. 07/11/20  Hall-Potvin, [place], PA-C  hydrALAZINE (APRESOLINE) 100 MG tablet Take 100 mg by mouth 2 (two) times daily.     [provider]  HYDROcodone-acetaminophen (NORCO/VICODIN) 5-325 MG tablet Take 1 tablet by mouth every 4 (four) hours as needed for moderate pain ((score 4 to 6)). 09/15/20   Julio Sicks, MD  losartan (COZAAR) 100 MG tablet Take 100 mg by mouth daily.    [provider]  naproxen (NAPROSYN) 500 MG tablet Take 1 tablet (500 mg total) by mouth 2 (two) times daily. Patient taking differently: Take 500 mg by mouth 2 (two) times daily as needed for mild  pain. 03/07/20   Hall-Potvin, [place], PA-C  pantoprazole (PROTONIX) 40 MG tablet Take 40 mg by mouth daily. 09/08/20   [provider]  spironolactone (ALDACTONE) 50 MG tablet TAKE 1 TABLET(50 MG) BY MOUTH DAILY Patient taking differently: Take 50 mg by mouth daily. 12/11/19   Patwardhan, Anabel Bene, MD  tamsulosin (FLOMAX) 0.4 MG CAPS capsule Take 0.4 mg by mouth daily. 08/05/20   [provider]    Family History Family History  Problem Relation Age of Onset   Heart attack Maternal Grandmother    Heart attack Paternal Grandmother     Social History Social History   Tobacco Use   Smoking status: Former    Years: 1.00    Types: Cigarettes   Smokeless tobacco: Never   Tobacco comments:    few a day   Vaping Use   Vaping Use: Never used  Substance Use Topics   Alcohol use: Never   Drug use: Never     Allergies   Gabapentin   Review of Systems Review of Systems  Constitutional:  Negative for chills and fever.  HENT:  Positive for ear pain. Negative for congestion and sore throat.   Eyes:  Negative for discharge and redness.  Respiratory:  Negative for cough and shortness of breath.   Gastrointestinal:  Negative for abdominal pain, nausea and vomiting.  Skin:  Positive for color change (to toenail).    Physical Exam Triage Vital Signs ED Triage Vitals  Enc Vitals Group     BP 06/14/21 1729 129/78     Pulse Rate 06/14/21 1729 80     Resp 06/14/21 1729 16     Temp 06/14/21 1729 97.9 F (36.6 C)     Temp Source 06/14/21 1729 Oral     SpO2 06/14/21 1729 97 %     Weight --      Height --      Head Circumference --      Peak Flow --      Pain Score 06/14/21 1732 10     Pain Loc --      Pain Edu? --      Excl. in GC? --    No data found.  Updated Vital Signs BP 129/78 (BP Location: Left Arm)   Pulse 80   Temp 97.9 F (36.6 C) (Oral)   Resp 16   SpO2 97%      Physical Exam Vitals and nursing note reviewed.  Constitutional:      General:  He is not in acute distress.    Appearance: Normal appearance. He is not ill-appearing.  HENT:     Head: Normocephalic and atraumatic.     Left Ear: Tympanic membrane normal.     Ears:     Comments: Erythematous right TM    Nose: Nose normal. No congestion.     Mouth/Throat:  Mouth: Mucous membranes are moist.     Pharynx: Oropharynx is clear. No oropharyngeal exudate or posterior oropharyngeal erythema.  Eyes:     Conjunctiva/sclera: Conjunctivae normal.  Cardiovascular:     Rate and Rhythm: Normal rate.  Pulmonary:     Effort: Pulmonary effort is normal. No respiratory distress.  Skin:    General: Skin is warm and dry.     Comments: Right great toenail with white discoloration diffusely  Neurological:     Mental Status: He is alert.  Psychiatric:        Mood and Affect: Mood normal.        Thought Content: Thought content normal.     UC Treatments / Results  Labs (all labs ordered are listed, but only abnormal results are displayed) Labs Reviewed - No data to display  EKG   Radiology No results found.  Procedures Procedures (including critical care time)  Medications Ordered in UC Medications - No data to display  Initial Impression / Assessment and Plan / UC Course  I have reviewed the triage vital signs and the nursing notes.  Pertinent labs & imaging results that were available during my care of the patient were reviewed by me and considered in my medical decision making (see chart for details).    Amoxicillin prescribed for otitis media. Recommend follow up if no improvement. Will also trial topical treatment for onychomycosis but suspect that he will need follow up with his PCP and advised him to follow up with same. Patient expresses understanding.   Final Clinical Impressions(s) / UC Diagnoses   Final diagnoses:  Other acute nonsuppurative otitis media of right ear, recurrence not specified  Onychomycosis   Discharge Instructions   None    ED  Prescriptions     Medication Sig Dispense Auth. Provider   amoxicillin (AMOXIL) 500 MG capsule Take 1 capsule (500 mg total) by mouth 3 (three) times daily. 21 capsule Erma Pinto F, PA-C   ciclopirox (LOPROX) 0.77 % SUSP Apply 1 application topically 2 (two) times daily for 14 days. 30 mL Tomi Bamberger, PA-C      PDMP not reviewed this encounter.   Tomi Bamberger, PA-C 06/15/21 [phone]

## 2021-10-22 ENCOUNTER — Ambulatory Visit
Admission: EM | Admit: 2021-10-22 | Discharge: 2021-10-22 | Disposition: A | Discharge status: Home / Self Care | Payer: Medicare Other | Attending: Family Medicine | Admitting: Family Medicine

## 2021-10-22 DIAGNOSIS — J301 Allergic rhinitis due to pollen: Secondary | ICD-10-CM | POA: Diagnosis not present

## 2021-10-22 DIAGNOSIS — R051 Acute cough: Secondary | ICD-10-CM | POA: Diagnosis not present

## 2021-10-22 MED ORDER — METHYLPREDNISOLONE ACETATE 80 MG/ML IJ SUSP
80.0000 mg | Freq: Once | INTRAMUSCULAR | Status: AC
  Administered 2021-10-22: 80 mg via INTRAMUSCULAR

## 2021-10-22 MED ORDER — PREDNISONE 20 MG PO TABS: 40.0000 mg | ORAL_TABLET | Freq: Every day | ORAL | 0 refills | Status: AC

## 2021-10-22 NOTE — Discharge Instructions (Addendum)
You have been given a shot of Depo-Medrol 80 mg here in the office ? ?Take prednisone 20 mg--2 daily for 5 days ? ?Go see your primary care physician in the next month or so ?

## 2021-10-22 NOTE — ED Provider Notes (Signed)
?[facility] ? ? ? ?CSN: [id number] ?Arrival date & time: 10/22/21  1006 ? ? ?  ? ?History   ?Chief Complaint ?Chief Complaint  ?Patient presents with  ? Cough  ? ? ?HPI ?Jerry [name] is a 46 y.o. male.  ? ? ?Cough ?Here for congestion and sneezing that he has had for about 2 weeks.  He has also been having some cough.  Yesterday it all got worse, and he thinks that is due to inhaling a good bit of fumes from some bleach.  No fever or chills noted.  No shortness of breath ? ?He also has had trouble flexing his left arm at the elbow and bending his left middle finger in the last month or so.  He has never been known to have gout.  He does have a primary doctor ? ?Past Medical History:  ?Diagnosis Date  ? Chronic back pain   ? Hypertension   ? ? ?Patient Active Problem List  ? Diagnosis Date Noted  ? Lumbar radiculopathy 09/14/2020  ? Leg edema 05/24/2019  ? Diabetes mellitus (HCC) 04/19/2019  ? Gastroesophageal reflux disease without esophagitis 08/12/2018  ? Obesity, morbid, BMI 50 or higher (HCC) 09/15/2014  ? Essential hypertension 10/20/2008  ? ? ?Past Surgical History:  ?Procedure Laterality Date  ? ACHILLES TENDON SURGERY    ? LUMBAR LAMINECTOMY/DECOMPRESSION MICRODISCECTOMY Right 09/14/2020  ? Procedure: Microdiscectomy - right - L4-L5;  Surgeon: Pool, Henry, MD;  Location: [facility] OR;  Service: Neurosurgery;  Laterality: Right;  3C  ? TOE SURGERY    ? TONSILLECTOMY    ? ? ? ? ? ?Home Medications   ? ?Prior to Admission medications   ?Medication Sig Start Date End Date Taking? Authorizing Provider  ?predniSONE (DELTASONE) 20 MG tablet Take 2 tablets (40 mg total) by mouth daily with breakfast for 5 days. 10/22/21 10/27/21 Yes Banister, Pamela K, MD  ?albuterol (VENTOLIN HFA) 108 (90 Base) MCG/ACT inhaler Inhale 1-2 puffs into the lungs every 6 (six) hours as needed for wheezing or shortness of breath. 08/25/20   Wieters, Hallie C, PA-C  ?amLODipine (NORVASC) 5 MG tablet Take 5 mg by mouth daily.     [provider]  ?fluticasone (FLONASE) 50 MCG/ACT nasal spray Place 1 spray into both nostrils daily. ?Patient taking differently: Place 1 spray into both nostrils daily as needed for allergies or rhinitis. 07/11/20   Hall-Potvin, Brittany, PA-C  ?hydrALAZINE (APRESOLINE) 100 MG tablet Take 100 mg by mouth 2 (two) times daily.     [provider]  ?losartan (COZAAR) 100 MG tablet Take 100 mg by mouth daily.    [provider]  ?spironolactone (ALDACTONE) 50 MG tablet TAKE 1 TABLET(50 MG) BY MOUTH DAILY ?Patient taking differently: Take 50 mg by mouth daily. 12/11/19   Patwardhan, Manish J, MD  ?tamsulosin (FLOMAX) 0.4 MG CAPS capsule Take 0.4 mg by mouth daily. 08/05/20   [provider]  ? ? ?Family History ?Family History  ?Problem Relation Age of Onset  ? Heart attack Maternal Grandmother   ? Heart attack Paternal Grandmother   ? ? ?Social History ?Social History  ? ?Tobacco Use  ? Smoking status: Former  ?  Years: 1.00  ?  Types: Cigarettes  ? Smokeless tobacco: Never  ? Tobacco comments:  ?  few a day   ?Vaping Use  ? Vaping Use: Never used  ?Substance Use Topics  ? Alcohol use: Never  ? Drug use: Never  ? ? ? ?Allergies   ?  Gabapentin ? ? ?Review of Systems ?Review of Systems  ?Respiratory:  Positive for cough.   ? ? ?Physical Exam ?Triage Vital Signs ?ED Triage Vitals  ?Enc Vitals Group  ?   BP 10/22/21 1051 (!) 162/89  ?   Pulse Rate 10/22/21 1051 75  ?   Resp 10/22/21 1051 18  ?   Temp 10/22/21 1051 97.7 ?F (36.5 ?C)  ?   Temp Source 10/22/21 1051 Oral  ?   SpO2 10/22/21 1051 98 %  ?   Weight --   ?   Height --   ?   Head Circumference --   ?   Peak Flow --   ?   Pain Score 10/22/21 1052 0  ?   Pain Loc --   ?   Pain Edu? --   ?   Excl. in [place]? --   ? ?No data found. ? ?Updated Vital Signs ?BP (!) 162/89 (BP Location: Left Arm)   Pulse 75   Temp 97.7 ?F (36.5 ?C) (Oral)   Resp 18   SpO2 98%  ? ?Visual Acuity ?Right Eye Distance:   ?Left Eye Distance:   ?Bilateral Distance:    ? ?Right Eye Near:   ?Left Eye Near:    ?Bilateral Near:    ? ?Physical Exam ?Vitals reviewed.  ?Constitutional:   ?   General: He is not in acute distress. ?   Appearance: He is not toxic-appearing.  ?HENT:  ?   Right Ear: Tympanic membrane and ear canal normal.  ?   Left Ear: Tympanic membrane and ear canal normal.  ?   Nose: Nose normal.  ?   Mouth/Throat:  ?   Mouth: Mucous membranes are moist.  ?   Comments: Clear mucus draining in OP ? ?Eyes:  ?   Extraocular Movements: Extraocular movements intact.  ?   Conjunctiva/sclera: Conjunctivae normal.  ?   Pupils: Pupils are equal, round, and reactive to light.  ?Cardiovascular:  ?   Rate and Rhythm: Normal rate and regular rhythm.  ?   Heart sounds: No murmur heard. ?Pulmonary:  ?   Effort: Pulmonary effort is normal.  ?   Breath sounds: Normal breath sounds.  ?Musculoskeletal:  ?   Cervical back: Neck supple.  ?Lymphadenopathy:  ?   Cervical: No cervical adenopathy.  ?Skin: ?   Capillary Refill: Capillary refill takes less than 2 seconds.  ?   Coloration: Skin is not jaundiced or pale.  ?Neurological:  ?   General: No focal deficit present.  ?   Mental Status: He is alert and oriented to person, place, and time.  ?Psychiatric:     ?   Behavior: Behavior normal.  ? ? ? ?UC Treatments / Results  ?Labs ?(all labs ordered are listed, but only abnormal results are displayed) ?Labs Reviewed  ?NOVEL CORONAVIRUS, NAA  ? ? ?EKG ? ? ?Radiology ?No results found. ? ?Procedures ?Procedures (including critical care time) ? ?Medications Ordered in UC ?Medications  ?methylPREDNISolone acetate (DEPO-MEDROL) injection 80 mg (has no administration in time range)  ? ? ?Initial Impression / Assessment and Plan / UC Course  ?I have reviewed the triage vital signs and the nursing notes. ? ?Pertinent labs & imaging results that were available during my care of the patient were reviewed by me and considered in my medical decision making (see chart for details). ? ?  ? ?We will treat with  some steroids orally and an injection.  That we will treat  both allergic rhinitis and possible gout.  We are doing a COVID swab today since he had acute worsening yesterday.  If his COVID is positive he is at risk for severe disease.  I would count yesterday April 13 is his first day of symptoms, and I would have him take Paxlovid if he is positive.  His last EGFR was normal ?Final Clinical Impressions(s) / UC Diagnoses  ? ?Final diagnoses:  ?Seasonal allergic rhinitis due to pollen  ?Acute cough  ? ? ? ?Discharge Instructions   ? ?  ?You have been given a shot of Depo-Medrol 80 mg here in the office ? ?Take prednisone 20 mg--2 daily for 5 days ? ?Go see your primary care physician in the next month or so ? ? ? ? ?ED Prescriptions   ? ? Medication Sig Dispense Auth. Provider  ? predniSONE (DELTASONE) 20 MG tablet Take 2 tablets (40 mg total) by mouth daily with breakfast for 5 days. 10 tablet Barrett Henle, MD  ? ?  ? ?I have reviewed the PDMP during this encounter. ?  ?Barrett Henle, MD ?10/22/21 1121 ? ?

## 2021-10-22 NOTE — ED Triage Notes (Signed)
Pt c/o cough, watery eyes,  ? ?Onset ~ yesterday  ? 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 ?Left finger swelling and "my elbow can't bend" "i'm basically going to need like some pain medication and some allergy stuff." Also requests provider work note for today and Monday.  ?

## 2021-10-23 LAB — NOVEL CORONAVIRUS, NAA: SARS-CoV-2, NAA: NOT DETECTED

## 2022-01-12 ENCOUNTER — Ambulatory Visit
Admission: EM | Admit: 2022-01-12 | Discharge: 2022-01-12 | Disposition: A | Discharge status: Home / Self Care | Payer: Medicare Other | Attending: Internal Medicine | Admitting: Internal Medicine

## 2022-01-12 DIAGNOSIS — M79642 Pain in left hand: Secondary | ICD-10-CM

## 2022-01-12 DIAGNOSIS — M79604 Pain in right leg: Secondary | ICD-10-CM

## 2022-01-12 DIAGNOSIS — M79605 Pain in left leg: Secondary | ICD-10-CM

## 2022-01-12 MED ORDER — METHOCARBAMOL 500 MG PO TABS: 500.0000 mg | ORAL_TABLET | Freq: Two times a day (BID) | ORAL | 0 refills | Status: DC | PRN

## 2022-01-12 MED ORDER — PREDNISONE 20 MG PO TABS: 40.0000 mg | ORAL_TABLET | Freq: Every day | ORAL | 0 refills | Status: AC

## 2022-01-12 NOTE — ED Triage Notes (Signed)
Pt present bilateral leg pain with tightness and left hand/middle finger pain. Symptoms started over a month ago. Pt tried home remedies with no relief.

## 2022-01-12 NOTE — ED Provider Notes (Addendum)
[facility]    CSN: [id number] Arrival date & time: 01/12/22  1021      History   Chief Complaint Chief Complaint  Patient presents with   Hand Pain    HPI Jerry [name] is a 46 y.o. male.   Patient presents with bilateral leg pain and left hand/finger pain that started about a month ago.  Patient was seen on 12/11/2021 by another healthcare provider.  He had an x-ray completed that was unremarkable and was advised NSAID therapy.  He states that he has taken ibuprofen here and there with minimal improvement in pain.  Denies any apparent injury to any of the areas that are concerning for pain.  He states that he works at [employer] and does a lot of prolonged standing as well as repetitive movements with his arms and hand.  Denies any numbness or tingling to any of the areas.  Reports that his left third digit has limited range of motion and he feels pain throughout his hand that radiates up his arm.  He has bilateral lower extremity pain that starts in the knees and radiates down the front of the legs.  Denies any recent prolonged travel.  Denies any associated chest pain, shortness of breath, dizziness, headache, blurred vision.   Hand Pain    Past Medical History:  Diagnosis Date   Chronic back pain    Hypertension     Patient Active Problem List   Diagnosis Date Noted   Lumbar radiculopathy 09/14/2020   Leg edema 05/24/2019   Diabetes mellitus (HCC) 04/19/2019   Gastroesophageal reflux disease without esophagitis 08/12/2018   Obesity, morbid, BMI 50 or higher (HCC) 09/15/2014   Essential hypertension 10/20/2008    Past Surgical History:  Procedure Laterality Date   ACHILLES TENDON SURGERY     LUMBAR LAMINECTOMY/DECOMPRESSION MICRODISCECTOMY Right 09/14/2020   Procedure: Microdiscectomy - right - L4-L5;  Surgeon: Julio Sicks, MD;  Location: [facility] OR;  Service: Neurosurgery;  Laterality: Right;  3C   TOE SURGERY     TONSILLECTOMY         Home  Medications    Prior to Admission medications   Medication Sig Start Date End Date Taking? Authorizing Provider  methocarbamol (ROBAXIN) 500 MG tablet Take 1 tablet (500 mg total) by mouth 2 (two) times daily as needed for muscle spasms. 01/12/22  Yes Namita Yearwood, Rolly Salter E, FNP  predniSONE (DELTASONE) 20 MG tablet Take 2 tablets (40 mg total) by mouth daily for 5 days. 01/12/22 01/17/22 Yes Mlissa Tamayo, Acie Fredrickson, FNP  albuterol (VENTOLIN HFA) 108 (90 Base) MCG/ACT inhaler Inhale 1-2 puffs into the lungs every 6 (six) hours as needed for wheezing or shortness of breath. 08/25/20   Wieters, Hallie C, PA-C  amLODipine (NORVASC) 5 MG tablet Take 5 mg by mouth daily.    [provider]  fluticasone (FLONASE) 50 MCG/ACT nasal spray Place 1 spray into both nostrils daily. Patient taking differently: Place 1 spray into both nostrils daily as needed for allergies or rhinitis. 07/11/20   Hall-Potvin, [place], PA-C  hydrALAZINE (APRESOLINE) 100 MG tablet Take 100 mg by mouth 2 (two) times daily.     [provider]  losartan (COZAAR) 100 MG tablet Take 100 mg by mouth daily.    [provider]  spironolactone (ALDACTONE) 50 MG tablet TAKE 1 TABLET(50 MG) BY MOUTH DAILY Patient taking differently: Take 50 mg by mouth daily. 12/11/19   Patwardhan, Anabel Bene, MD  tamsulosin (FLOMAX) 0.4 MG CAPS capsule  Take 0.4 mg by mouth daily. 08/05/20   [provider]    Family History Family History  Problem Relation Age of Onset   Heart attack Maternal Grandmother    Heart attack Paternal Grandmother     Social History Social History   Tobacco Use   Smoking status: Former    Years: 1.00    Types: Cigarettes   Smokeless tobacco: Never   Tobacco comments:    few a day   Vaping Use   Vaping Use: Never used  Substance Use Topics   Alcohol use: Never   Drug use: Never     Allergies   Gabapentin   Review of Systems Review of Systems Per HPI  Physical Exam Triage Vital Signs ED Triage  Vitals  Enc Vitals Group     BP 01/12/22 1132 (!) 145/68     Pulse Rate 01/12/22 1132 85     Resp 01/12/22 1132 18     Temp 01/12/22 1132 98.3 F (36.8 C)     Temp Source 01/12/22 1132 Oral     SpO2 01/12/22 1132 97 %     Weight --      Height --      Head Circumference --      Peak Flow --      Pain Score 01/12/22 1131 10     Pain Loc --      Pain Edu? --      Excl. in [place]? --    No data found.  Updated Vital Signs BP (!) 145/68 (BP Location: Left Arm)   Pulse 85   Temp 98.3 F (36.8 C) (Oral)   Resp 18   SpO2 97%   Visual Acuity Right Eye Distance:   Left Eye Distance:   Bilateral Distance:    Right Eye Near:   Left Eye Near:    Bilateral Near:     Physical Exam Constitutional:      Appearance: Normal appearance.  HENT:     Head: Normocephalic and atraumatic.  Eyes:     Extraocular Movements: Extraocular movements intact.     Conjunctiva/sclera: Conjunctivae normal.  Pulmonary:     Effort: Pulmonary effort is normal.  Musculoskeletal:     Comments: Tenderness to palpation generalized throughout left third digit.  Patient is not able to fully straighten finger due to pain.  He is able to flex finger completely.  Tenderness to palpation to palmar surface of hand in various areas as well.  Tenderness to palpation to anterior elbow with tenderness to the lateral epicondyle.  Patient has full range of motion of elbow, wrist, hand.  Grip strength 5/5.  Capillary refill and pulses normal.  Neurovascular intact.  No abrasions or lacerations noted.  No discoloration, swelling, warmth noted.  Negative Tinel's and Phalen sign.  Tenderness to palpation in various areas on bilateral lower extremities with main area being in anterior and posterior knee that is generalized.  No obvious swelling, discoloration, warmth noted.  No lacerations or abrasions noted.  Patient has full range of motion of knee, lower extremity, foot, toes.  Neurovascular intact.  Neurological:      General: No focal deficit present.     Mental Status: He is alert and oriented to person, place, and time. Mental status is at baseline.  Psychiatric:        Mood and Affect: Mood normal.        Behavior: Behavior normal.        Thought Content: Thought  content normal.        Judgment: Judgment normal.      UC Treatments / Results  Labs (all labs ordered are listed, but only abnormal results are displayed) Labs Reviewed - No data to display  EKG   Radiology No results found.  Procedures Procedures (including critical care time)  Medications Ordered in UC Medications - No data to display  Initial Impression / Assessment and Plan / UC Course  I have reviewed the triage vital signs and the nursing notes.  Pertinent labs & imaging results that were available during my care of the patient were reviewed by me and considered in my medical decision making (see chart for details).     Patient has various areas of pain that appears musculoskeletal in nature.  No concern for DVT, infection, cardiac etiology.  Patient has taken prednisone in the past and has tolerated well and I do think that patient would benefit from prednisone to decrease inflammation in upper extremity and bilateral lower extremities.  Patient also prescribed muscle relaxer to take as needed but was advised that muscle relaxer can cause drowsiness.  Due to duration of symptoms, I do think it would be beneficial for patient to follow-up with PCP and orthopedist at provided contact information for further evaluation and management.  Do not think imaging is necessary given that left hand x-ray was unremarkable about a month ago and there is no previous injuries.  There is also no direct bony tenderness of knees and no obvious injury so do not think imaging is necessary there as well.  Due to repetitive movements of hand, it is possible that carpal tunnel versus lateral epicondylitis could be present.  Although, physical exam  was unremarkable for this and not definitive.  Low suspicion for trigger finger of left third digit but this is possible as well.  Patient is still able to flex and move finger but just has limited range of motion so will defer splinting for any of these at this time as physical exam is not definitive.  Will treat conservatively with anti-inflammatory medication and ice application.  Patient also advised of strict follow-up with specialist.  Discussed return precautions.  Patient verbalized understanding and was agreeable with plan. Final Clinical Impressions(s) / UC Diagnoses   Final diagnoses:  Left hand pain  Bilateral lower extremity pain     Discharge Instructions      I am suspicious that you are having musculoskeletal pain.  You are being treated with prednisone to decrease inflammation.  May also apply ice to affected areas.  You have also been prescribed a muscle relaxer to take as needed.  Please be advised that muscle relaxer can cause drowsiness so do not drive while taking this medication.  Please follow-up with orthopedist for further evaluation and management given the duration of your symptoms.  Also recommend that you see your primary care doctor as well.    ED Prescriptions     Medication Sig Dispense Auth. Provider   predniSONE (DELTASONE) 20 MG tablet Take 2 tablets (40 mg total) by mouth daily for 5 days. 10 tablet Notus, Wailea E, [place]   methocarbamol (ROBAXIN) 500 MG tablet Take 1 tablet (500 mg total) by mouth 2 (two) times daily as needed for muscle spasms. 20 tablet Brenham, Acie Fredrickson, [place]      PDMP not reviewed this encounter.   Gustavus Bryant, [place] 01/12/22 1311    Gustavus Bryant, [place] 01/12/22 1311

## 2022-01-12 NOTE — Discharge Instructions (Addendum)
I am suspicious that you are having musculoskeletal pain.  You are being treated with prednisone to decrease inflammation.  May also apply ice to affected areas.  You have also been prescribed a muscle relaxer to take as needed.  Please be advised that muscle relaxer can cause drowsiness so do not drive while taking this medication.  Please follow-up with orthopedist for further evaluation and management given the duration of your symptoms.  Also recommend that you see your primary care doctor as well.

## 2022-10-20 ENCOUNTER — Other Ambulatory Visit: Payer: Self-pay

## 2022-10-20 ENCOUNTER — Emergency Department (HOSPITAL_COMMUNITY): Payer: 59

## 2022-10-20 ENCOUNTER — Emergency Department (HOSPITAL_COMMUNITY)
Admission: EM | Admit: 2022-10-20 | Discharge: 2022-10-20 | Disposition: A | Discharge status: Home / Self Care | Payer: 59 | Attending: Emergency Medicine | Admitting: Emergency Medicine

## 2022-10-20 DIAGNOSIS — R55 Syncope and collapse: Secondary | ICD-10-CM | POA: Diagnosis not present

## 2022-10-20 DIAGNOSIS — R0602 Shortness of breath: Secondary | ICD-10-CM | POA: Diagnosis not present

## 2022-10-20 DIAGNOSIS — I10 Essential (primary) hypertension: Secondary | ICD-10-CM | POA: Insufficient documentation

## 2022-10-20 DIAGNOSIS — G935 Compression of brain: Secondary | ICD-10-CM | POA: Insufficient documentation

## 2022-10-20 DIAGNOSIS — Z79899 Other long term (current) drug therapy: Secondary | ICD-10-CM | POA: Diagnosis not present

## 2022-10-20 DIAGNOSIS — E119 Type 2 diabetes mellitus without complications: Secondary | ICD-10-CM | POA: Insufficient documentation

## 2022-10-20 LAB — COMPREHENSIVE METABOLIC PANEL
ALT: 27 U/L (ref 0–44)
AST: 18 U/L (ref 15–41)
Albumin: 3.7 g/dL (ref 3.5–5.0)
Alkaline Phosphatase: 53 U/L (ref 38–126)
Anion gap: 11 (ref 5–15)
BUN: 18 mg/dL (ref 6–20)
CO2: 18 mmol/L — ABNORMAL LOW (ref 22–32)
Calcium: 9 mg/dL (ref 8.9–10.3)
Chloride: 108 mmol/L (ref 98–111)
Creatinine, Ser: 1.11 mg/dL (ref 0.61–1.24)
GFR, Estimated: >60 mL/min (ref >60)
Glucose, Bld: 107 mg/dL — ABNORMAL HIGH (ref 70–99)
Potassium: 3.4 mmol/L — ABNORMAL LOW (ref 3.5–5.1)
Sodium: 137 mmol/L (ref 135–145)
Total Bilirubin: 1.4 mg/dL — ABNORMAL HIGH (ref 0.3–1.2)
Total Protein: 7.6 g/dL (ref 6.5–8.1)

## 2022-10-20 LAB — CBC WITH DIFFERENTIAL/PLATELET
Abs Immature Granulocytes: 0.01 10*3/uL (ref 0.00–0.07)
Basophils Absolute: 0.1 10*3/uL (ref 0.0–0.1)
Basophils Relative: 1 %
Eosinophils Absolute: 0.2 10*3/uL (ref 0.0–0.5)
Eosinophils Relative: 3 %
HCT: 43.3 % (ref 39.0–52.0)
Hemoglobin: 14.7 g/dL (ref 13.0–17.0)
Immature Granulocytes: 0 %
Lymphocytes Relative: 28 %
Lymphs Abs: 1.5 10*3/uL (ref 0.7–4.0)
MCH: 29.3 pg (ref 26.0–34.0)
MCHC: 33.9 g/dL (ref 30.0–36.0)
MCV: 86.4 fL (ref 80.0–100.0)
Monocytes Absolute: 0.4 10*3/uL (ref 0.1–1.0)
Monocytes Relative: 7 %
Neutro Abs: 3.3 10*3/uL (ref 1.7–7.7)
Neutrophils Relative %: 61 %
Platelets: 187 10*3/uL (ref 150–400)
RBC: 5.01 MIL/uL (ref 4.22–5.81)
RDW: 13.9 % (ref 11.5–15.5)
WBC: 5.5 10*3/uL (ref 4.0–10.5)
nRBC: 0 % (ref 0.0–0.2)

## 2022-10-20 LAB — URINALYSIS, ROUTINE W REFLEX MICROSCOPIC
Bilirubin Urine: NEGATIVE
Glucose, UA: NEGATIVE mg/dL
Hgb urine dipstick: NEGATIVE
Ketones, ur: 5 mg/dL — AB
Leukocytes,Ua: NEGATIVE
Nitrite: NEGATIVE
Protein, ur: NEGATIVE mg/dL
Specific Gravity, Urine: 1.02 (ref 1.005–1.030)
pH: 7 (ref 5.0–8.0)

## 2022-10-20 LAB — CK: Total CK: 214 U/L (ref 49–397)

## 2022-10-20 LAB — CBG MONITORING, ED: Glucose-Capillary: 64 mg/dL — ABNORMAL LOW (ref 70–99)

## 2022-10-20 LAB — BRAIN NATRIURETIC PEPTIDE: B Natriuretic Peptide: 47.8 pg/mL (ref 0.0–100.0)

## 2022-10-20 LAB — D-DIMER, QUANTITATIVE: D-Dimer, Quant: 0.41 ug/mL-FEU (ref 0.00–0.50)

## 2022-10-20 MED ORDER — SODIUM CHLORIDE 0.9 % IV BOLUS
1000.0000 mL | Freq: Once | INTRAVENOUS | Status: AC
  Administered 2022-10-20: 1000 mL via INTRAVENOUS

## 2022-10-20 NOTE — ED Notes (Signed)
Tried to get patient blood. I didn't have any success. ?

## 2022-10-20 NOTE — ED Notes (Signed)
Update pt family  wanda scott [phone]

## 2022-10-20 NOTE — Discharge Instructions (Addendum)
You were evaluated today for a syncopal episode.  Your workup was overall reassuring.  Your blood sugar did seem to be low.  This may be due to your recent episode of fasting. Follow up with your primary care provider as needed.  I have placed a referral for neurology. If you do not hear from their office please contact for an appointment. 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 Neurology can provide further evaluation and management as needed.

## 2022-10-20 NOTE — ED Triage Notes (Signed)
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 He was ease to the floor not trauma noted. EMS reported patient  c/o a headache since he got up this morning. Per EMS patient is saying repetitive statement, but found the altered at times, but when patient is clear he can answer questions. BP 142 98, Pulse 80, RR 18, 97% RA, Blood sugar 84

## 2022-10-20 NOTE — ED Provider Notes (Signed)
[place] EMERGENCY DEPARTMENT AT [facility] Provider Note   CSN: [id number] Arrival date & time: 10/20/22  0856     History  Chief Complaint  Patient presents with   Near Syncope    Jerry [name] is a 47 y.o. male.  Patient presents to the emergency department via EMS due to a near syncopal episode.  Patient was at work when he began to feel really lightheaded.  He was eased to the floor and did not fall.  Patient reports that he has had a headache since waking up this morning.  He also reports fasting for the past day.  EMS reports that upon initial examination the patient was alert and oriented but was occasionally repeating words and phrases.  Upon arrival at the emergency department the patient is alert and oriented with clear and fluent speech.  The patient does endorse increased stress over the past few weeks.  He denies chest pain, abdominal pain, nausea, vomiting.  Endorses generalized muscle aches for the past few months, mild shortness of breath over the past few weeks.  Past medical history significant for hypertension, type II DM, GERD, chronic back pain, obesity  HPI     Home Medications Prior to Admission medications   Medication Sig Start Date End Date Taking? Authorizing Provider  albuterol (VENTOLIN HFA) 108 (90 Base) MCG/ACT inhaler Inhale 1-2 puffs into the lungs every 6 (six) hours as needed for wheezing or shortness of breath. 08/25/20   Wieters, Hallie C, PA-C  amLODipine (NORVASC) 5 MG tablet Take 5 mg by mouth daily.    [provider]  fluticasone (FLONASE) 50 MCG/ACT nasal spray Place 1 spray into both nostrils daily. Patient taking differently: Place 1 spray into both nostrils daily as needed for allergies or rhinitis. 07/11/20   Hall-Potvin, [place], PA-C  hydrALAZINE (APRESOLINE) 100 MG tablet Take 100 mg by mouth 2 (two) times daily.     [provider]  losartan (COZAAR) 100 MG tablet Take 100 mg by mouth daily.    [provider]  methocarbamol (ROBAXIN) 500 MG tablet Take 1 tablet (500 mg total) by mouth 2 (two) times daily as needed for muscle spasms. 01/12/22   Gustavus Bryant, FNP  spironolactone (ALDACTONE) 50 MG tablet TAKE 1 TABLET(50 MG) BY MOUTH DAILY Patient taking differently: Take 50 mg by mouth daily. 12/11/19   Patwardhan, Anabel Bene, MD  tamsulosin (FLOMAX) 0.4 MG CAPS capsule Take 0.4 mg by mouth daily. 08/05/20   [provider]      Allergies    Gabapentin    Review of Systems   Review of Systems  Physical Exam Updated Vital Signs BP 127/77   Pulse 68   Temp 98.4 F (36.9 C) (Oral)   Resp 17   Ht 5\' 7"  (1.702 m)   Wt 120.2 kg   SpO2 100%   BMI 41.50 kg/m  Physical Exam Vitals and nursing note reviewed.  Constitutional:      General: He is not in acute distress.    Appearance: He is well-developed. He is obese.  HENT:     Head: Normocephalic and atraumatic.  Eyes:     Conjunctiva/sclera: Conjunctivae normal.  Cardiovascular:     Rate and Rhythm: Normal rate and regular rhythm.     Heart sounds: No murmur heard. Pulmonary:     Effort: Pulmonary effort is normal. No respiratory distress.     Breath sounds: Normal breath sounds.  Abdominal:  Palpations: Abdomen is soft.     Tenderness: There is no abdominal tenderness.  Musculoskeletal:        General: No swelling.     Cervical back: Neck supple.  Skin:    General: Skin is warm and dry.     Capillary Refill: Capillary refill takes less than 2 seconds.  Neurological:     General: No focal deficit present.     Mental Status: He is alert.     Sensory: No sensory deficit.     Motor: No weakness.     Coordination: Coordination normal.     Gait: Gait normal.     Comments: Cranial nerves II through VII, XI, XII intact  Psychiatric:        Mood and Affect: Mood normal.     ED Results / Procedures / Treatments   Labs (all labs ordered are listed, but only abnormal results are displayed) Labs Reviewed   COMPREHENSIVE METABOLIC PANEL - Abnormal; Notable for the following components:      Result Value   Potassium 3.4 (*)    CO2 18 (*)    Glucose, Bld 107 (*)    Total Bilirubin 1.4 (*)    All other components within normal limits  URINALYSIS, ROUTINE W REFLEX MICROSCOPIC - Abnormal; Notable for the following components:   Ketones, ur 5 (*)    All other components within normal limits  CBG MONITORING, ED - Abnormal; Notable for the following components:   Glucose-Capillary 64 (*)    All other components within normal limits  CBC WITH DIFFERENTIAL/PLATELET  BRAIN NATRIURETIC PEPTIDE  CK  D-DIMER, QUANTITATIVE    EKG EKG Interpretation  Date/Time:  Thursday October 20 2022 09:02:16 EDT Ventricular Rate:  62 PR Interval:  212 QRS Duration: 119 QT Interval:  426 QTC Calculation: 433 R Axis:   -53 Text Interpretation: Sinus rhythm Prolonged PR interval Incomplete RBBB and LAFB Left ventricular hypertrophy when compared to prior, overall similar appearance. No STEMI Confirmed by Theda Belfast ([id number]) on 10/20/2022 1:11:30 PM  Radiology CT Head Wo Contrast  Result Date: 10/20/2022 CLINICAL DATA:  Headache, increasing frequency or severity. Near syncope. EXAM: CT HEAD WITHOUT CONTRAST TECHNIQUE: Contiguous axial images were obtained from the base of the skull through the vertex without intravenous contrast. RADIATION DOSE REDUCTION: This exam was performed according to the departmental dose-optimization program which includes automated exposure control, adjustment of the mA and/or kV according to patient size and/or use of iterative reconstruction technique. COMPARISON:  None Available. FINDINGS: Brain: There is no evidence of an acute infarct, intracranial hemorrhage, mass, midline shift, or extra-axial fluid collection. The ventricles and sulci are normal. Cerebellar tonsillar ectopia measures up to 8 mm on the right with effacement of the CSF spaces at the foramen magnum. Vascular:  Calcified atherosclerosis at the skull base. No hyperdense vessel. Skull: No fracture or suspicious osseous lesion. Sinuses/Orbits: Visualized paranasal sinuses and mastoid air cells are clear. Unremarkable orbits. Other: None. IMPRESSION: 1. No evidence of acute infarct or hemorrhage. 2. 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 Electronically Signed   By: Sebastian Ache M.D.   On: 10/20/2022 10:20   DG Chest 2 View  Result Date: 10/20/2022 CLINICAL DATA:  Shortness of breath and headache with apparent altered mental status EXAM: CHEST - 2 VIEW COMPARISON:  Chest radiograph dated 06/07/2019 FINDINGS: Mildly low lung volumes. No focal consolidations. No pleural effusion or pneumothorax.  The heart size and mediastinal contours are within normal limits. The visualized skeletal structures are unremarkable. IMPRESSION: No active cardiopulmonary disease. Electronically Signed   By: Agustin CreeLimin  Xu M.D.   On: 10/20/2022 10:10    Procedures Procedures    Medications Ordered in ED Medications  sodium chloride 0.9 % bolus 1,000 mL (1,000 mLs Intravenous New Bag/Given 10/20/22 1044)    ED Course/ Medical Decision Making/ A&P                             Medical Decision Making Amount and/or Complexity of Data Reviewed Labs: ordered. Radiology: ordered.   This patient presents to the ED for concern of near syncopal episode, this involves an extensive number of treatment options, and is a complaint that carries with it a high risk of complications and morbidity.  The differential diagnosis includes vasovagal syncope, dehydration, metabolic abnormalities, intracranial abnormalities, arrhythmia, and others   Co morbidities that complicate the patient evaluation  Type II DM, obesity   Additional history obtained:  Additional history obtained from family at bedside, EMS   Lab Tests:  I  Ordered, and personally interpreted labs.  The pertinent results include: Negative D-dimer, BNP 47.8, CK 214, potassium 3.4, unremarkable CBC, unremarkable UA   Imaging Studies ordered:  I ordered imaging studies including CT head, chest x-ray I independently visualized and interpreted imaging which showed  1. No evidence of acute infarct or hemorrhage.  2. 8 mm cerebellar tonsillar ectopia, suspected to reflect Chiari I  malformation although intracranial hypertension and hypotension can  also give this appearance.  Chest x-ray showed no acute findings I agree with the radiologist interpretation   Cardiac Monitoring: / EKG:  The patient was maintained on a cardiac monitor.  I personally viewed and interpreted the cardiac monitored which showed an underlying rhythm of: Sinus rhythm   Consultations Obtained:  I requested consultation with the neurologist, Dr. Selina CooleyStack, and discussed lab and imaging findings as well as pertinent plan - they recommend: Outpatient follow-up for patient's possible Chiari I malformation   Problem List / ED Course / Critical interventions / Medication management   I ordered medication including normal saline for fluid resuscitation Reevaluation of the patient after these medicines showed that the patient improved I have reviewed the patients home medicines and have made adjustments as needed    Test / Admission - Considered:  Patient feels better after fluids and food.  Believe this is likely due to patient's fasting over the past day.  Blood sugar was borderline low with a CBG of 64 upon arrival.  CT scan was grossly unremarkable except for possible Chiari I malformation.  Neurology states this is amenable to outpatient follow-up.  No other significant acute/abnormal findings on exam or in lab work.  Orthostatic changes were negative no dysrhythmia on EKG.        Final Clinical Impression(s) / ED Diagnoses Final diagnoses:  Syncope, unspecified  syncope type  Chiari malformation type I    Rx / DC Orders ED Discharge Orders          Ordered    Ambulatory referral to Neurology       Comments: An appointment is requested in approximately: 4 weeks   10/20/22 1452              Pamala DuffelMcCauley, Marquerite Forsman B, PA-C 10/20/22 1455    Tegeler, Canary Brimhristopher J, MD 10/20/22 [phone]

## 2023-07-04 ENCOUNTER — Emergency Department (HOSPITAL_BASED_OUTPATIENT_CLINIC_OR_DEPARTMENT_OTHER)
Admission: EM | Admit: 2023-07-04 | Discharge: 2023-07-04 | Disposition: A | Discharge status: Home / Self Care | Payer: 59 | Attending: Emergency Medicine | Admitting: Emergency Medicine

## 2023-07-04 ENCOUNTER — Other Ambulatory Visit: Payer: Self-pay

## 2023-07-04 ENCOUNTER — Emergency Department (HOSPITAL_BASED_OUTPATIENT_CLINIC_OR_DEPARTMENT_OTHER): Payer: 59 | Admitting: Radiology

## 2023-07-04 ENCOUNTER — Encounter (HOSPITAL_BASED_OUTPATIENT_CLINIC_OR_DEPARTMENT_OTHER): Payer: Self-pay | Admitting: Emergency Medicine

## 2023-07-04 DIAGNOSIS — E119 Type 2 diabetes mellitus without complications: Secondary | ICD-10-CM | POA: Diagnosis not present

## 2023-07-04 DIAGNOSIS — R0602 Shortness of breath: Secondary | ICD-10-CM | POA: Diagnosis present

## 2023-07-04 DIAGNOSIS — Z79899 Other long term (current) drug therapy: Secondary | ICD-10-CM | POA: Diagnosis not present

## 2023-07-04 DIAGNOSIS — I10 Essential (primary) hypertension: Secondary | ICD-10-CM | POA: Diagnosis not present

## 2023-07-04 LAB — CBC
HCT: 39.2 % (ref 39.0–52.0)
Hemoglobin: 13.1 g/dL (ref 13.0–17.0)
MCH: 29.4 pg (ref 26.0–34.0)
MCHC: 33.4 g/dL (ref 30.0–36.0)
MCV: 87.9 fL (ref 80.0–100.0)
Platelets: 178 10*3/uL (ref 150–400)
RBC: 4.46 MIL/uL (ref 4.22–5.81)
RDW: 13.8 % (ref 11.5–15.5)
WBC: 6.3 10*3/uL (ref 4.0–10.5)
nRBC: 0 % (ref 0.0–0.2)

## 2023-07-04 LAB — BASIC METABOLIC PANEL
Anion gap: 8 (ref 5–15)
BUN: 20 mg/dL (ref 6–20)
CO2: 23 mmol/L (ref 22–32)
Calcium: 9.1 mg/dL (ref 8.9–10.3)
Chloride: 105 mmol/L (ref 98–111)
Creatinine, Ser: 1.06 mg/dL (ref 0.61–1.24)
GFR, Estimated: >60 mL/min (ref >60)
Glucose, Bld: 86 mg/dL (ref 70–99)
Potassium: 3.8 mmol/L (ref 3.5–5.1)
Sodium: 136 mmol/L (ref 135–145)

## 2023-07-04 LAB — TROPONIN I (HIGH SENSITIVITY): Troponin I (High Sensitivity): 6 ng/L (ref <18)

## 2023-07-04 MED ORDER — ALBUTEROL SULFATE (2.5 MG/3ML) 0.083% IN NEBU: 5.0000 mg | INHALATION_SOLUTION | Freq: Once | RESPIRATORY_TRACT | Status: DC

## 2023-07-04 NOTE — ED Notes (Signed)
RT ambulated the Pt on RA. The Pt's SATS were 98-100%

## 2023-07-04 NOTE — ED Notes (Signed)
RT assessed the Pt. The Pt's lung sounds were clear.He had a PFT yesterday and he used an inhaler this morning

## 2023-07-04 NOTE — ED Notes (Signed)
Patient transported to X-ray 

## 2023-07-04 NOTE — ED Provider Notes (Signed)
[place] EMERGENCY DEPARTMENT AT [facility] Provider Note   CSN: [id number] Arrival date & time: 07/04/23  1050     History  Chief Complaint  Patient presents with   Shortness of Breath    Jerry [name] is a 47 y.o. male.  Patient with history of morbid obesity, diabetes, OSA on CPAP, hypertension presents today with complaints of shortness of breath. States that same began a few days ago.  Notes that he went to his primary doctor this morning and had PFTs performed to check for asthma.  He does not know what the results showed, however he does note that he felt better after receiving the nebulizer treatment.  He does have an inhaler at home that he has been using with improvement as well.  He states that initially he still felt some tightness in his chest as well as dyspnea which caused him to come in today, however soon after arriving his symptoms resolved and have not returned.  He states he suspects that his symptoms are due to stress given that he has only gotten a few hours of sleep in the last few days and has not been eating or drinking very well. Notes that he has been under increased stress due to the holidays as well and suspects this is the etiology of his symptoms.  Denies any cough or congestion.  No leg pain or leg swelling.  No recent travel or surgeries.  No history of malignancy or blood clots.  The history is provided by the patient. No language interpreter was used.  Shortness of Breath      Home Medications Prior to Admission medications   Medication Sig Start Date End Date Taking? Authorizing Provider  albuterol (VENTOLIN HFA) 108 (90 Base) MCG/ACT inhaler Inhale 1-2 puffs into the lungs every 6 (six) hours as needed for wheezing or shortness of breath. 08/25/20   Wieters, Hallie C, PA-C  amLODipine (NORVASC) 5 MG tablet Take 5 mg by mouth daily.    [provider]  fluticasone (FLONASE) 50 MCG/ACT nasal spray Place 1 spray into both  nostrils daily. Patient taking differently: Place 1 spray into both nostrils daily as needed for allergies or rhinitis. 07/11/20   Hall-Potvin, [place], PA-C  hydrALAZINE (APRESOLINE) 100 MG tablet Take 100 mg by mouth 2 (two) times daily.     [provider]  losartan (COZAAR) 100 MG tablet Take 100 mg by mouth daily.    [provider]  methocarbamol (ROBAXIN) 500 MG tablet Take 1 tablet (500 mg total) by mouth 2 (two) times daily as needed for muscle spasms. 01/12/22   Gustavus Bryant, FNP  spironolactone (ALDACTONE) 50 MG tablet TAKE 1 TABLET(50 MG) BY MOUTH DAILY Patient taking differently: Take 50 mg by mouth daily. 12/11/19   Patwardhan, Anabel Bene, MD  tamsulosin (FLOMAX) 0.4 MG CAPS capsule Take 0.4 mg by mouth daily. 08/05/20   [provider]      Allergies    Gabapentin    Review of Systems   Review of Systems  Respiratory:  Positive for shortness of breath.   All other systems reviewed and are negative.   Physical Exam Updated Vital Signs BP 125/87   Pulse 69   Temp 97.9 F (36.6 C) (Oral)   Resp 12   Ht 5\' 7"  (1.702 m)   Wt 131.5 kg   SpO2 99%   BMI 45.42 kg/m  Physical Exam Vitals and nursing note reviewed.  Constitutional:  General: He is not in acute distress.    Appearance: Normal appearance. He is obese. He is not ill-appearing, toxic-appearing or diaphoretic.  HENT:     Head: Normocephalic and atraumatic.  Cardiovascular:     Rate and Rhythm: Normal rate and regular rhythm.     Heart sounds: Normal heart sounds.  Pulmonary:     Effort: Pulmonary effort is normal. No respiratory distress.     Breath sounds: Normal breath sounds. No wheezing.  Chest:     Chest wall: No tenderness.  Abdominal:     Palpations: Abdomen is soft.     Tenderness: There is no abdominal tenderness.  Musculoskeletal:        General: Normal range of motion.     Cervical back: Normal range of motion and neck supple.     Right lower leg: No tenderness.  No edema.     Left lower leg: No tenderness. No edema.  Skin:    General: Skin is warm and dry.  Neurological:     General: No focal deficit present.     Mental Status: He is alert.  Psychiatric:        Mood and Affect: Mood normal.        Behavior: Behavior normal.     ED Results / Procedures / Treatments   Labs (all labs ordered are listed, but only abnormal results are displayed) Labs Reviewed  BASIC METABOLIC PANEL  CBC  TROPONIN I (HIGH SENSITIVITY)  TROPONIN I (HIGH SENSITIVITY)    EKG EKG Interpretation Date/Time:  Tuesday July 04 2023 10:59:38 EST Ventricular Rate:  70 PR Interval:  190 QRS Duration:  120 QT Interval:  409 QTC Calculation: 442 R Axis:   -54  Text Interpretation: Sinus rhythm Probable left atrial enlargement Incomplete RBBB and LAFB Left ventricular hypertrophy ST elev, probable normal early repol pattern Confirmed by Ernie Avena ([id number]) on 07/04/2023 12:02:06 PM  Radiology DG Chest 2 View Result Date: 07/04/2023 CLINICAL DATA:  Chest pain and shortness of breath EXAM: CHEST - 2 VIEW COMPARISON:  05/10/2023 FINDINGS: The heart size and mediastinal contours are within normal limits. Both lungs are clear. The visualized skeletal structures are unremarkable except for degenerative changes throughout the spine. Trachea midline. IMPRESSION: No active cardiopulmonary disease. Electronically Signed   By: Judie Petit.  Shick M.D.   On: 07/04/2023 11:58    Procedures Procedures    Medications Ordered in ED Medications  albuterol (PROVENTIL) (2.5 MG/3ML) 0.083% nebulizer solution 5 mg (5 mg Nebulization Not Given 07/04/23 1249)    ED Course/ Medical Decision Making/ A&P                                 Medical Decision Making Amount and/or Complexity of Data Reviewed Labs: ordered. Radiology: ordered.  Risk Prescription drug management.   This patient is a 47 y.o. male who presents to the ED for concern of chest tightness, shortness of breath,  this involves an extensive number of treatment options, and is a complaint that carries with it a high risk of complications and morbidity. The emergent differential diagnosis prior to evaluation includes, but is not limited to,  CHF, pericardial effusion/tamponade, arrhythmias, ACS, COPD, asthma, bronchitis, pneumonia, pneumothorax, PE, anemia    This is not an exhaustive differential.   Past Medical History / Co-morbidities / Social History:  has a past medical history of Chronic back pain and Hypertension.  Additional history: Chart reviewed.  Physical Exam: Physical exam performed. The pertinent findings include: Well-appearing, speaking in complete sentences, no leg pain or leg swelling.  Lung sounds clear to auscultation in all fields.  Lab Tests: I ordered, and personally interpreted labs.  The pertinent results include: Troponin negative, no acute laboratory abnormalities.   Imaging Studies: I ordered imaging studies including CXR. I independently visualized and interpreted imaging which showed NAD. I agree with the radiologist interpretation.   Cardiac Monitoring:  The patient was maintained on a cardiac monitor.  Cardiac monitor showed an underlying rhythm of: sinus rhythm, no STEMI. I agree with this interpretation.   Disposition: After consideration of the diagnostic results and the patients response to treatment, I feel that emergency department workup does not suggest an emergent condition requiring admission or immediate intervention beyond what has been performed at this time. The plan is: Discharge with close outpatient follow-up and return precautions.  Patient's workup is benign and his symptoms have resolved.  He is able to ambulate without any dyspnea and actually began jogging on ambulation without issue.  He has a low risk heart score and is PERC negative. Did consider evaluation for PE given description of symptoms, however patient refused further work-up and would  prefer to go home which is reasonable.  Recommend he continue to use his home inhalers as needed.  Close return precautions recommended and discussed as well. Evaluation and diagnostic testing in the emergency department does not suggest an emergent condition requiring admission or immediate intervention beyond what has been performed at this time.  Plan for discharge with close PCP follow-up.  Patient is understanding and amenable with plan, educated on red flag symptoms that would prompt immediate return.  Patient discharged in stable condition.  Final Clinical Impression(s) / ED Diagnoses Final diagnoses:  Shortness of breath    Rx / DC Orders ED Discharge Orders     None     An After Visit Summary was printed and given to the patient.     Vear Clock 07/04/23 1323    Ernie Avena, MD 07/05/23 [phone]

## 2023-07-04 NOTE — ED Triage Notes (Signed)
Pt caox4 c/o SOB and CP x2 days. Pt has used albuterol inhaler at home with minimal relief. Pt also reports non productive cough. CP described as tightness and intermittent, pt SOB on minimal exertion. Lung sounds clear and equal.

## 2023-07-04 NOTE — Discharge Instructions (Addendum)
As we discussed, your workup in the ER today was reassuring for acute findings.  Laboratory evaluation, x-ray imaging, and EKG did not reveal any emergent cause of your symptoms.  I recommend that you get plenty of rest and use your home inhalers for management of your symptoms.  You will likely also see great benefit for reducing your stress levels.  Follow-up with your primary doctor.  Return if development of any new or worsening symptoms.

## 2023-07-04 NOTE — ED Notes (Signed)
Discharge paperwork given and verbally understood. 

## 2023-07-24 ENCOUNTER — Emergency Department (HOSPITAL_COMMUNITY): Payer: 59

## 2023-07-24 ENCOUNTER — Emergency Department (HOSPITAL_COMMUNITY)
Admission: EM | Admit: 2023-07-24 | Discharge: 2023-07-25 | Disposition: A | Discharge status: Home / Self Care | Payer: 59 | Attending: Emergency Medicine | Admitting: Emergency Medicine

## 2023-07-24 ENCOUNTER — Other Ambulatory Visit: Payer: Self-pay

## 2023-07-24 DIAGNOSIS — M25512 Pain in left shoulder: Secondary | ICD-10-CM | POA: Insufficient documentation

## 2023-07-24 LAB — BASIC METABOLIC PANEL
Anion gap: 10 (ref 5–15)
BUN: 21 mg/dL — ABNORMAL HIGH (ref 6–20)
CO2: 20 mmol/L — ABNORMAL LOW (ref 22–32)
Calcium: 8.7 mg/dL — ABNORMAL LOW (ref 8.9–10.3)
Chloride: 110 mmol/L (ref 98–111)
Creatinine, Ser: 1.31 mg/dL — ABNORMAL HIGH (ref 0.61–1.24)
GFR, Estimated: >60 mL/min (ref >60)
Glucose, Bld: 99 mg/dL (ref 70–99)
Potassium: 3.5 mmol/L (ref 3.5–5.1)
Sodium: 140 mmol/L (ref 135–145)

## 2023-07-24 LAB — TROPONIN I (HIGH SENSITIVITY)
Troponin I (High Sensitivity): 8 ng/L (ref <18)
Troponin I (High Sensitivity): 9 ng/L (ref <18)

## 2023-07-24 LAB — CBC
HCT: 39 % (ref 39.0–52.0)
Hemoglobin: 12.9 g/dL — ABNORMAL LOW (ref 13.0–17.0)
MCH: 29.4 pg (ref 26.0–34.0)
MCHC: 33.1 g/dL (ref 30.0–36.0)
MCV: 88.8 fL (ref 80.0–100.0)
Platelets: 193 10*3/uL (ref 150–400)
RBC: 4.39 MIL/uL (ref 4.22–5.81)
RDW: 13.6 % (ref 11.5–15.5)
WBC: 6.7 10*3/uL (ref 4.0–10.5)
nRBC: 0 % (ref 0.0–0.2)

## 2023-07-24 MED ORDER — KETOROLAC TROMETHAMINE 30 MG/ML IJ SOLN
15.0000 mg | Freq: Once | INTRAMUSCULAR | Status: AC
  Administered 2023-07-24: 15 mg via INTRAVENOUS
  Filled 2023-07-24: qty 1

## 2023-07-24 NOTE — ED Triage Notes (Signed)
 Patient via EMS for eval of chest pain onset while at work today. Has radiation to L arm and back. 324 ASA and 0.4 mg nitroglycerin, and 4mg  morphine given by EMS with relief.

## 2023-07-24 NOTE — ED Provider Triage Note (Signed)
 Emergency Medicine Provider Triage Evaluation Note  Jerry [name] , a 48 y.o. male  was evaluated in triage.  Pt complains of left-sided shoulder pain, chest pain, back pain for the last few days.  He had a mechanical fall, ever since then he has been having pain in that area.  Also reports cramping in his legs and a lesion in his right wrist.  Review of Systems  Positive: Back pain, chest pain Negative: Abdominal pain, arrhythmia  Physical Exam  BP 129/80 (BP Location: Right Arm)   Pulse 78   Temp 98 F (36.7 C) (Oral)   Resp 16   SpO2 100%  Gen:   Awake, no distress   Resp:  Normal effort  MSK:   Moves extremities without difficulty  Other:  Patient has reproducible tenderness with palpation over the scapular region and also with forward flexion, abduction.  He also has a right sided cyst just proximal to the wrist that is mobile, firm  Medical Decision Making  Medically screening exam initiated at 2:58 PM.  Appropriate orders placed.  Jerry [name] was informed that the remainder of the evaluation will be completed by another provider, this initial triage assessment does not replace that evaluation, and the importance of remaining in the ED until their evaluation is complete.  EKG is reassuring.  Labs ordered. Seems like a musculoskeletal pain.   EKG Interpretation Date/Time:  Monday July 24 2023 14:40:08 EST Ventricular Rate:  77 PR Interval:  178 QRS Duration:  112 QT Interval:  394 QTC Calculation: 445 R Axis:   -51  Text Interpretation: Normal sinus rhythm with sinus arrhythmia Incomplete right bundle branch block Left anterior fascicular block Left ventricular hypertrophy ( R in aVL , Cornell product , Romhilt-Estes ) Abnormal ECG No significant change since last tracing Confirmed by Charlyn Sora [id number]) on 07/24/2023 3:00:52 PM          Charlyn Sora, MD 07/24/23 1501

## 2023-07-25 MED ORDER — NAPROXEN 500 MG PO TABS: 500.0000 mg | ORAL_TABLET | Freq: Two times a day (BID) | ORAL | 0 refills | Status: DC

## 2023-07-25 NOTE — ED Provider Notes (Signed)
 [place] EMERGENCY DEPARTMENT AT [facility] Provider Note   CSN: [id number] Arrival date & time: 07/24/23  1433     History  No chief complaint on file.   Jerry [name] is a 48 y.o. male. Patient presents to the emergency department via EMS complaining of left arm/shoulder pain which has been ongoing for approximately 2 months since falling at work.  Patient also complains of left-sided chest pain which began at work today.  Patient was administered aspirin and 1 dose of nitroglycerin along with 4 mg of morphine by EMS.  He was also administered 30 mg of Toradol  in triage upon arrival.  Patient denies shortness of breath, abdominal pain, nausea, vomiting.  He states that pain is worse with movement.  Past medical history significant hypertension, chronic back pain  HPI     Home Medications Prior to Admission medications   Medication Sig Start Date End Date Taking? Authorizing Provider  naproxen  (NAPROSYN ) 500 MG tablet Take 1 tablet (500 mg total) by mouth 2 (two) times daily. 07/25/23  Yes Logan Ubaldo NOVAK, PA-C  albuterol  (VENTOLIN  HFA) 108 (90 Base) MCG/ACT inhaler Inhale 1-2 puffs into the lungs every 6 (six) hours as needed for wheezing or shortness of breath. 08/25/20   Wieters, Hallie C, PA-C  amLODipine  (NORVASC ) 5 MG tablet Take 5 mg by mouth daily.    [provider]  fluticasone  (FLONASE ) 50 MCG/ACT nasal spray Place 1 spray into both nostrils daily. Patient taking differently: Place 1 spray into both nostrils daily as needed for allergies or rhinitis. 07/11/20   Hall-Potvin, Brittany, PA-C  hydrALAZINE  (APRESOLINE ) 100 MG tablet Take 100 mg by mouth 2 (two) times daily.     [provider]  losartan  (COZAAR ) 100 MG tablet Take 100 mg by mouth daily.    [provider]  methocarbamol  (ROBAXIN ) 500 MG tablet Take 1 tablet (500 mg total) by mouth 2 (two) times daily as needed for muscle spasms. 01/12/22   Hazen Darryle BRAVO, FNP   spironolactone  (ALDACTONE ) 50 MG tablet TAKE 1 TABLET(50 MG) BY MOUTH DAILY Patient taking differently: Take 50 mg by mouth daily. 12/11/19   Patwardhan, Newman JINNY, MD  tamsulosin  (FLOMAX ) 0.4 MG CAPS capsule Take 0.4 mg by mouth daily. 08/05/20   [provider]      Allergies    Gabapentin    Review of Systems   Review of Systems  Physical Exam Updated Vital Signs BP 131/75 (BP Location: Right Arm)   Pulse 75   Temp 97.7 F (36.5 C)   Resp (!) 22   SpO2 95%  Physical Exam Vitals and nursing note reviewed.  Constitutional:      General: He is not in acute distress.    Appearance: He is well-developed.  HENT:     Head: Normocephalic and atraumatic.  Eyes:     Conjunctiva/sclera: Conjunctivae normal.  Cardiovascular:     Rate and Rhythm: Normal rate and regular rhythm.     Heart sounds: No murmur heard. Pulmonary:     Effort: Pulmonary effort is normal. No respiratory distress.     Breath sounds: Normal breath sounds.  Abdominal:     Palpations: Abdomen is soft.     Tenderness: There is no abdominal tenderness.  Musculoskeletal:        General: No swelling or tenderness. Normal range of motion.     Cervical back: Neck supple.     Comments: Patient with grossly normal range of motion of the  left upper extremity.  Patient does complain of pain with internal and external rotation of the left upper extremity along with abduction.  Sensation intact.  Skin:    General: Skin is warm and dry.     Capillary Refill: Capillary refill takes less than 2 seconds.  Neurological:     Mental Status: He is alert.  Psychiatric:        Mood and Affect: Mood normal.     ED Results / Procedures / Treatments   Labs (all labs ordered are listed, but only abnormal results are displayed) Labs Reviewed  BASIC METABOLIC PANEL - Abnormal; Notable for the following components:      Result Value   CO2 20 (*)    BUN 21 (*)    Creatinine, Ser 1.31 (*)    Calcium 8.7 (*)    All other  components within normal limits  CBC - Abnormal; Notable for the following components:   Hemoglobin 12.9 (*)    All other components within normal limits  TROPONIN I (HIGH SENSITIVITY)  TROPONIN I (HIGH SENSITIVITY)    EKG EKG Interpretation Date/Time:  Monday July 24 2023 14:40:08 EST Ventricular Rate:  77 PR Interval:  178 QRS Duration:  112 QT Interval:  394 QTC Calculation: 445 R Axis:   -51  Text Interpretation: Normal sinus rhythm with sinus arrhythmia Incomplete right bundle branch block Left anterior fascicular block Left ventricular hypertrophy ( R in aVL , Cornell product , Romhilt-Estes ) Abnormal ECG No significant change since last tracing Confirmed by Charlyn Sora [phone]) on 07/24/2023 3:00:52 PM  Radiology DG Shoulder Left Result Date: 07/24/2023 CLINICAL DATA:  Shoulder pain. EXAM: LEFT SHOULDER - 2+ VIEW COMPARISON:  None Available. FINDINGS: There is no evidence of fracture or dislocation. The glenohumeral joint is anatomically aligned. The acromioclavicular joint is anatomically aligned with moderate degenerative changes manifested by joint space narrowing and marginal osteophytosis. Soft tissues are unremarkable. IMPRESSION: 1. No acute osseous abnormality 2. 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 Electronically Signed   By: Harrietta Sherry M.D.   On: 07/24/2023 17:24   DG Chest 2 View Result Date: 07/24/2023 CLINICAL DATA:  Chest pain. EXAM: CHEST - 2 VIEW COMPARISON:  Chest radiograph dated July 04, 2023. FINDINGS: The heart size and mediastinal contours are unchanged. No focal consolidation, pleural effusion, or pneumothorax. No acute osseous abnormality. IMPRESSION: No acute cardiopulmonary findings. Electronically Signed   By: Harrietta Sherry M.D.   On: 07/24/2023 17:23    Procedures .Ortho Injury Treatment  Date/Time: 07/25/2023 4:23 AM  Performed by: Logan Ubaldo NOVAK, PA-C Authorized by: Logan Ubaldo NOVAK, PA-C   Consent:     Consent obtained:  Verbal   Consent given by:  PatientInjury location: shoulder Location details: left shoulder Injury type: soft tissue Pre-procedure neurovascular assessment: neurovascularly intact Immobilization: sling Splint Applied by: ED Nurse Post-procedure neurovascular assessment: post-procedure neurovascularly intact       Medications Ordered in ED Medications  ketorolac  (TORADOL ) 30 MG/ML injection 15 mg (15 mg Intravenous Given 07/24/23 1506)    ED Course/ Medical Decision Making/ A&P                                 Medical Decision Making  This patient presents to the ED for concern of left shoulder pain and chest pain, this involves an extensive number of treatment options, and is a complaint that carries with it a high risk  of complications and morbidity.  The differential diagnosis includes musculoskeletal pain, ACS, pneumonia, PE, others   Co morbidities that complicate the patient evaluation  Chronic back pain, hypertension   Additional history obtained:  Additional history obtained from EMS   Lab Tests:  I Ordered, and personally interpreted labs.  The pertinent results include: Mildly elevated creatinine at 1.31, grossly unremarkable CBC, negative troponins x 2   Imaging Studies ordered:  I ordered imaging studies including shoulder x-ray, chest x-ray I independently visualized and interpreted imaging which showed  1. No acute osseous abnormality  2. 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  No acute chest findings I agree with the radiologist interpretation   Cardiac Monitoring: / EKG:  The patient was maintained on a cardiac monitor.  I personally viewed and interpreted the cardiac monitored which showed an underlying rhythm of: sinus rhythm   Problem List / ED Course / Critical interventions / Medication management   I ordered medication including toradol  for inflammation  Reevaluation of the patient after  these medicines showed that the patient improved I have reviewed the patients home medicines and have made adjustments as needed   Test / Admission - Considered:  Patient with nonischemic EKG, negative troponins x 2.  No signs of ACS.  No shortness of breath to suggest pulmonary embolism.  No pneumonia on chest x-ray.  Patient's chest pain has completely resolved.  He continues to plaint of left-sided shoulder pain.  Patient has mildly elevated creatinine but GFR greater than 60.  Feel it is reasonable to give patient a short course of Naprosyn .  Patient may also take Tylenol  for pain.  Will provide contact information for orthopedics for further follow-up.  Patient placed in sling for comfort.  Return precautions provided.         Final Clinical Impression(s) / ED Diagnoses Final diagnoses:  Left shoulder pain, unspecified chronicity    Rx / DC Orders ED Discharge Orders          Ordered    naproxen  (NAPROSYN ) 500 MG tablet  2 times daily        07/25/23 0425              Logan Ubaldo NOVAK, PA-C 07/25/23 0427    Lorette Mayo, MD 07/25/23 [phone]

## 2023-07-25 NOTE — Discharge Instructions (Addendum)
 Your workup tonight was reassuring.  You have been placed in a sling for your left shoulder for comfort.  If you find your shoulder no longer hurts you may remove the sling.  If you continue to have pain I recommend following up with orthopedic surgery for further evaluation.  I have provided contact information.  If you develop any life-threatening symptoms please return to the emergency department.

## 2024-01-02 ENCOUNTER — Other Ambulatory Visit: Payer: Self-pay

## 2024-01-02 ENCOUNTER — Emergency Department (HOSPITAL_COMMUNITY)
Admission: EM | Admit: 2024-01-02 | Discharge: 2024-01-02 | Disposition: A | Discharge status: Home / Self Care | Attending: Emergency Medicine | Admitting: Emergency Medicine

## 2024-01-02 DIAGNOSIS — X30XXXA Exposure to excessive natural heat, initial encounter: Secondary | ICD-10-CM | POA: Insufficient documentation

## 2024-01-02 DIAGNOSIS — T6701XA Heatstroke and sunstroke, initial encounter: Secondary | ICD-10-CM | POA: Diagnosis present

## 2024-01-02 DIAGNOSIS — I10 Essential (primary) hypertension: Secondary | ICD-10-CM | POA: Insufficient documentation

## 2024-01-02 DIAGNOSIS — Z79899 Other long term (current) drug therapy: Secondary | ICD-10-CM | POA: Diagnosis not present

## 2024-01-02 DIAGNOSIS — Z87891 Personal history of nicotine dependence: Secondary | ICD-10-CM | POA: Diagnosis not present

## 2024-01-02 DIAGNOSIS — T679XXA Effect of heat and light, unspecified, initial encounter: Secondary | ICD-10-CM

## 2024-01-02 DIAGNOSIS — E876 Hypokalemia: Secondary | ICD-10-CM | POA: Diagnosis not present

## 2024-01-02 DIAGNOSIS — E1165 Type 2 diabetes mellitus with hyperglycemia: Secondary | ICD-10-CM | POA: Diagnosis not present

## 2024-01-02 LAB — URINALYSIS, ROUTINE W REFLEX MICROSCOPIC
Bilirubin Urine: NEGATIVE
Glucose, UA: NEGATIVE mg/dL
Hgb urine dipstick: NEGATIVE
Ketones, ur: NEGATIVE mg/dL
Leukocytes,Ua: NEGATIVE
Nitrite: NEGATIVE
Protein, ur: NEGATIVE mg/dL
Specific Gravity, Urine: 1.026 (ref 1.005–1.030)
pH: 6 (ref 5.0–8.0)

## 2024-01-02 LAB — COMPREHENSIVE METABOLIC PANEL WITH GFR
ALT: 16 U/L (ref 0–44)
AST: 15 U/L (ref 15–41)
Albumin: 2.8 g/dL — ABNORMAL LOW (ref 3.5–5.0)
Alkaline Phosphatase: 37 U/L — ABNORMAL LOW (ref 38–126)
Anion gap: 7 (ref 5–15)
BUN: 20 mg/dL (ref 6–20)
CO2: 19 mmol/L — ABNORMAL LOW (ref 22–32)
Calcium: 7.3 mg/dL — ABNORMAL LOW (ref 8.9–10.3)
Chloride: 112 mmol/L — ABNORMAL HIGH (ref 98–111)
Creatinine, Ser: 0.95 mg/dL (ref 0.61–1.24)
GFR, Estimated: >60 mL/min (ref >60)
Glucose, Bld: 68 mg/dL — ABNORMAL LOW (ref 70–99)
Potassium: 3 mmol/L — ABNORMAL LOW (ref 3.5–5.1)
Sodium: 138 mmol/L (ref 135–145)
Total Bilirubin: 0.7 mg/dL (ref 0.0–1.2)
Total Protein: 6.2 g/dL — ABNORMAL LOW (ref 6.5–8.1)

## 2024-01-02 LAB — CBC
HCT: 40.3 % (ref 39.0–52.0)
Hemoglobin: 13.4 g/dL (ref 13.0–17.0)
MCH: 30.3 pg (ref 26.0–34.0)
MCHC: 33.3 g/dL (ref 30.0–36.0)
MCV: 91.2 fL (ref 80.0–100.0)
Platelets: 197 10*3/uL (ref 150–400)
RBC: 4.42 MIL/uL (ref 4.22–5.81)
RDW: 14.4 % (ref 11.5–15.5)
WBC: 7.4 10*3/uL (ref 4.0–10.5)
nRBC: 0 % (ref 0.0–0.2)

## 2024-01-02 LAB — CBG MONITORING, ED: Glucose-Capillary: 112 mg/dL — ABNORMAL HIGH (ref 70–99)

## 2024-01-02 MED ORDER — SODIUM CHLORIDE 0.9 % IV BOLUS
500.0000 mL | Freq: Once | INTRAVENOUS | Status: AC
  Administered 2024-01-02: 500 mL via INTRAVENOUS

## 2024-01-02 MED ORDER — POTASSIUM CHLORIDE 10 MEQ/100ML IV SOLN
10.0000 meq | Freq: Once | INTRAVENOUS | Status: AC
  Administered 2024-01-02: 10 meq via INTRAVENOUS
  Filled 2024-01-02: qty 100

## 2024-01-02 MED ORDER — POTASSIUM CHLORIDE CRYS ER 20 MEQ PO TBCR
40.0000 meq | EXTENDED_RELEASE_TABLET | Freq: Once | ORAL | Status: AC
  Administered 2024-01-02: 40 meq via ORAL
  Filled 2024-01-02: qty 2

## 2024-01-02 NOTE — ED Provider Notes (Signed)
 [place] EMERGENCY DEPARTMENT AT [facility] Provider Note   CSN: [id number] Arrival date & time: 01/02/24  1753     Patient presents with: Heat Exposure   Jerry [name] is a 48 y.o. male.   48 year old male presenting with heat exhaustion.  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  These symptoms have since resolved, he was given some IV fluids en route with EMS.  He denies chest pain, shortness of breath, abdominal pain, muscle aches/cramping.        Prior to Admission medications   Medication Sig Start Date End Date Taking? Authorizing Provider  albuterol  (VENTOLIN  HFA) 108 (90 Base) MCG/ACT inhaler Inhale 1-2 puffs into the lungs every 6 (six) hours as needed for wheezing or shortness of breath. 08/25/20   Wieters, Hallie C, PA-C  amLODipine  (NORVASC ) 5 MG tablet Take 5 mg by mouth daily.    [provider]  fluticasone  (FLONASE ) 50 MCG/ACT nasal spray Place 1 spray into both nostrils daily. Patient taking differently: Place 1 spray into both nostrils daily as needed for allergies or rhinitis. 07/11/20   Hall-Potvin, [place], PA-C  hydrALAZINE  (APRESOLINE ) 100 MG tablet Take 100 mg by mouth 2 (two) times daily.     [provider]  losartan  (COZAAR ) 100 MG tablet Take 100 mg by mouth daily.    [provider]  methocarbamol  (ROBAXIN ) 500 MG tablet Take 1 tablet (500 mg total) by mouth 2 (two) times daily as needed for muscle spasms. 01/12/22   Hazen Darryle BRAVO, FNP  naproxen  (NAPROSYN ) 500 MG tablet Take 1 tablet (500 mg total) by mouth 2 (two) times daily. 07/25/23   Logan Ubaldo NOVAK, PA-C  spironolactone  (ALDACTONE ) 50 MG tablet TAKE 1 TABLET(50 MG) BY MOUTH DAILY Patient taking differently: Take 50 mg by mouth daily. 12/11/19   Patwardhan, Newman JINNY, MD  tamsulosin  (FLOMAX ) 0.4 MG CAPS capsule Take 0.4 mg by mouth daily. 08/05/20   [provider]     Allergies: Gabapentin    Review of Systems  Updated Vital Signs BP (!) 129/94   Pulse 79   Temp 98.7 F (37.1 C) (Oral)   Resp 16   Ht 5' 5 (1.651 m)   Wt 133.8 kg   SpO2 100%   BMI 49.09 kg/m     Physical Exam Vitals and nursing note reviewed.  HENT:     Head: Normocephalic.   Eyes:     Extraocular Movements: Extraocular movements intact.     Pupils: Pupils are equal, round, and reactive to light.    Cardiovascular:     Rate and Rhythm: Normal rate and regular rhythm.  Pulmonary:     Effort: Pulmonary effort is normal.     Breath sounds: Normal breath sounds.   Musculoskeletal:     Cervical back: Normal range of motion.     Comments: Moves all extremities spontaneously without difficulty 5 out of 5 strength against resistance of bilateral upper and lower extremities Grip strength equal and intact bilaterally   Skin:    General: Skin is warm and dry.   Neurological:     General: No focal deficit present.     Mental Status: He is alert and oriented to person, place, and time.     Comments: Facial expressions are intact and symmetric without evidence of facial droop Normal cerebellar testing including finger-to-nose No sensory deficits    (all  labs ordered are listed, but only abnormal results are displayed) Labs Reviewed  COMPREHENSIVE METABOLIC PANEL WITH GFR - Abnormal; Notable for the following components:      Result Value   Potassium 3.0 (*)    Chloride 112 (*)    CO2 19 (*)    Glucose, Bld 68 (*)    Calcium 7.3 (*)    Total Protein 6.2 (*)    Albumin 2.8 (*)    Alkaline Phosphatase 37 (*)    All other components within normal limits  CBC  URINALYSIS, ROUTINE W REFLEX MICROSCOPIC  CBG MONITORING, ED    EKG: None  Radiology: No results found.   Procedures   Medications Ordered in the ED  potassium chloride  10 mEq in 100 mL IVPB (10 mEq Intravenous New Bag/Given 01/02/24 2009)  sodium chloride  0.9 % bolus 500 mL (500 mLs  Intravenous New Bag/Given 01/02/24 1853)  potassium chloride  SA (KLOR-CON  M) CR tablet 40 mEq (40 mEq Oral Given 01/02/24 2007)                                    Medical Decision Making This patient presents to the ED for concern of heat stress, this involves an extensive number of treatment options, and is a complaint that carries with it a high risk of complications and morbidity.  The differential diagnosis includes heat exhaustion, heatstroke, heat syncope, hyperthermia, dehydration.   Co morbidities that complicate the patient evaluation  Diabetes, hypertension   Additional history obtained:  Additional history obtained from record review External records from outside source obtained and reviewed including recent urgent care note   Lab Tests:  I Ordered, and personally interpreted labs.  The pertinent results include: CBC unremarkable.  Urinalysis unremarkable.  CMP notable for hypokalemia with potassium of 3, borderline hypoglycemia with glucose of 68.   Cardiac Monitoring: / EKG:  The patient was maintained on a cardiac monitor.  I personally viewed and interpreted the cardiac monitored which showed an underlying rhythm of: NSR   Problem List / ED Course / Critical interventions / Medication management  IV fluids for rehydration I ordered medication including IV/p.o. potassium for hypokalemia  I have reviewed the patients home medicines and have made adjustments as needed   Social Determinants of Health:  Former tobacco use   Test / Admission - Considered:  Physical exam is unremarkable, see above for detailed report.  I have low suspicion for heatstroke given that patient is not exhibiting any signs of neurodeficits, he is alert and oriented and not demonstrating any evidence of confusion.  Vitals are reassuring.  Patient is tolerating p.o. well, but I did provide additional IV fluid bolus for rehydration.  IV/p.o. potassium given for hypokalemia, patient given  juice for borderline hypoglycemia, CBG rechecked after juice was notable for improvement of 112.  Labs are otherwise reassuring, I do not feel that the patient would benefit from additional workup at this time.  I encouraged the patient to avoid additional heat exposure today, encouraged him to continue to push fluids to avoid dehydration.  He voiced understanding and is in agreement with this plan.  I encouraged him to follow-up with his primary care provider if his symptoms persist, return precautions discussed.  He is appropriate for discharge at this time.   Amount and/or Complexity of Data Reviewed Labs: ordered.  Risk Prescription drug management.        Final  diagnoses:  Heat exposure, initial encounter  Hypokalemia    ED Discharge Orders     None          Glendia Rocky LOISE DEVONNA 01/02/24 2100    Garrick Charleston, MD 01/07/24 [phone]

## 2024-01-02 NOTE — ED Triage Notes (Signed)
 Patient to ED by EMS for heat exposure. He states he works at [employer] and was pushing buggies outside when he became over heated. EMS started 20g in L AC and gave 100cc of NS. HX of HTN. 100/80 80

## 2024-01-02 NOTE — Discharge Instructions (Addendum)
 Avoid additional heat exposure today, continue to push fluids to avoid dehydration.  Follow-up with your primary care provider if your symptoms persist, contact your primary care provider in regard to your low potassium.  Return to the emergency department if your symptoms worsen.

## 2024-07-08 ENCOUNTER — Other Ambulatory Visit: Payer: Self-pay

## 2024-07-08 ENCOUNTER — Emergency Department (HOSPITAL_COMMUNITY)

## 2024-07-08 ENCOUNTER — Observation Stay (HOSPITAL_COMMUNITY)
Admission: EM | Admit: 2024-07-08 | Discharge: 2024-07-10 | Disposition: A | Discharge status: Home / Self Care | Attending: Infectious Diseases | Admitting: Infectious Diseases

## 2024-07-08 ENCOUNTER — Encounter (HOSPITAL_COMMUNITY): Payer: Self-pay

## 2024-07-08 DIAGNOSIS — F909 Attention-deficit hyperactivity disorder, unspecified type: Secondary | ICD-10-CM | POA: Diagnosis not present

## 2024-07-08 DIAGNOSIS — N400 Enlarged prostate without lower urinary tract symptoms: Secondary | ICD-10-CM | POA: Diagnosis not present

## 2024-07-08 DIAGNOSIS — Z1152 Encounter for screening for COVID-19: Secondary | ICD-10-CM | POA: Diagnosis not present

## 2024-07-08 DIAGNOSIS — I10 Essential (primary) hypertension: Secondary | ICD-10-CM | POA: Diagnosis not present

## 2024-07-08 DIAGNOSIS — M5441 Lumbago with sciatica, right side: Secondary | ICD-10-CM | POA: Diagnosis not present

## 2024-07-08 DIAGNOSIS — N179 Acute kidney failure, unspecified: Secondary | ICD-10-CM | POA: Insufficient documentation

## 2024-07-08 DIAGNOSIS — R7989 Other specified abnormal findings of blood chemistry: Secondary | ICD-10-CM

## 2024-07-08 DIAGNOSIS — Z79899 Other long term (current) drug therapy: Secondary | ICD-10-CM | POA: Insufficient documentation

## 2024-07-08 DIAGNOSIS — R059 Cough, unspecified: Secondary | ICD-10-CM | POA: Diagnosis present

## 2024-07-08 DIAGNOSIS — G4733 Obstructive sleep apnea (adult) (pediatric): Secondary | ICD-10-CM | POA: Diagnosis not present

## 2024-07-08 DIAGNOSIS — F17210 Nicotine dependence, cigarettes, uncomplicated: Secondary | ICD-10-CM | POA: Diagnosis not present

## 2024-07-08 DIAGNOSIS — I160 Hypertensive urgency: Secondary | ICD-10-CM | POA: Diagnosis not present

## 2024-07-08 DIAGNOSIS — J45909 Unspecified asthma, uncomplicated: Secondary | ICD-10-CM | POA: Diagnosis not present

## 2024-07-08 DIAGNOSIS — K219 Gastro-esophageal reflux disease without esophagitis: Secondary | ICD-10-CM | POA: Insufficient documentation

## 2024-07-08 DIAGNOSIS — R0602 Shortness of breath: Secondary | ICD-10-CM | POA: Insufficient documentation

## 2024-07-08 DIAGNOSIS — R569 Unspecified convulsions: Secondary | ICD-10-CM | POA: Diagnosis not present

## 2024-07-08 DIAGNOSIS — R079 Chest pain, unspecified: Principal | ICD-10-CM | POA: Diagnosis present

## 2024-07-08 DIAGNOSIS — M5430 Sciatica, unspecified side: Secondary | ICD-10-CM

## 2024-07-08 LAB — BASIC METABOLIC PANEL WITH GFR
Anion gap: 13 (ref 5–15)
BUN: 22 mg/dL — ABNORMAL HIGH (ref 6–20)
CO2: 21 mmol/L — ABNORMAL LOW (ref 22–32)
Calcium: 9.2 mg/dL (ref 8.9–10.3)
Chloride: 104 mmol/L (ref 98–111)
Creatinine, Ser: 1.35 mg/dL — ABNORMAL HIGH (ref 0.61–1.24)
GFR, Estimated: >60 mL/min
Glucose, Bld: 91 mg/dL (ref 70–99)
Potassium: 3.6 mmol/L (ref 3.5–5.1)
Sodium: 138 mmol/L (ref 135–145)

## 2024-07-08 LAB — CBC
HCT: 43.1 % (ref 39.0–52.0)
Hemoglobin: 14.3 g/dL (ref 13.0–17.0)
MCH: 30.6 pg (ref 26.0–34.0)
MCHC: 33.2 g/dL (ref 30.0–36.0)
MCV: 92.3 fL (ref 80.0–100.0)
Platelets: 151 K/uL (ref 150–400)
RBC: 4.67 MIL/uL (ref 4.22–5.81)
RDW: 14.2 % (ref 11.5–15.5)
WBC: 8.3 K/uL (ref 4.0–10.5)
nRBC: 0 % (ref 0.0–0.2)

## 2024-07-08 LAB — TROPONIN T, HIGH SENSITIVITY: Troponin T High Sensitivity: 75 ng/L — ABNORMAL HIGH (ref 0–19)

## 2024-07-08 NOTE — ED Triage Notes (Signed)
 Patient here with chest wall tightness that started this weekend and he states it is unbearable. He states the he is also having sciatica pain and a headache as well. Chest tightness is on the right side that radiates into his shoulder

## 2024-07-08 NOTE — ED Provider Triage Note (Signed)
 Emergency Medicine Provider Triage Evaluation Note  Jerry [name] , a 48 y.o. male  was evaluated in triage.  Pt complains of multiple complaints. Report diffuse chest discomfort with congestion and headache x 4 days.  C/o R side sciatica pain for several months.  Also report increasing stress.  No fever, productive cough, nasal congestion, bowel bladder incontinence or saddle anesthesia  Review of Systems  Positive: As above Negative: As above  Physical Exam  BP (!) 152/93 (BP Location: Right Arm)   Pulse 94   Temp 98.2 F (36.8 C)   Resp 18   SpO2 98%  Gen:   Awake, no distress   Resp:  Normal effort  MSK:   Moves extremities without difficulty  Other:    Medical Decision Making  Medically screening exam initiated at 6:09 PM.  Appropriate orders placed.  Jerry [name] was informed that the remainder of the evaluation will be completed by another provider, this initial triage assessment does not replace that evaluation, and the importance of remaining in the ED until their evaluation is complete.     Nivia Colon, PA-C 07/11/24 [phone]

## 2024-07-09 DIAGNOSIS — F909 Attention-deficit hyperactivity disorder, unspecified type: Secondary | ICD-10-CM

## 2024-07-09 DIAGNOSIS — I214 Non-ST elevation (NSTEMI) myocardial infarction: Secondary | ICD-10-CM

## 2024-07-09 DIAGNOSIS — G4733 Obstructive sleep apnea (adult) (pediatric): Secondary | ICD-10-CM

## 2024-07-09 DIAGNOSIS — K219 Gastro-esophageal reflux disease without esophagitis: Secondary | ICD-10-CM

## 2024-07-09 DIAGNOSIS — F17210 Nicotine dependence, cigarettes, uncomplicated: Secondary | ICD-10-CM | POA: Diagnosis not present

## 2024-07-09 DIAGNOSIS — J45901 Unspecified asthma with (acute) exacerbation: Secondary | ICD-10-CM | POA: Diagnosis not present

## 2024-07-09 DIAGNOSIS — M5430 Sciatica, unspecified side: Secondary | ICD-10-CM | POA: Diagnosis not present

## 2024-07-09 DIAGNOSIS — I10 Essential (primary) hypertension: Secondary | ICD-10-CM | POA: Diagnosis not present

## 2024-07-09 DIAGNOSIS — Z8669 Personal history of other diseases of the nervous system and sense organs: Secondary | ICD-10-CM

## 2024-07-09 DIAGNOSIS — Z79899 Other long term (current) drug therapy: Secondary | ICD-10-CM

## 2024-07-09 DIAGNOSIS — R079 Chest pain, unspecified: Principal | ICD-10-CM

## 2024-07-09 DIAGNOSIS — N400 Enlarged prostate without lower urinary tract symptoms: Secondary | ICD-10-CM

## 2024-07-09 DIAGNOSIS — N179 Acute kidney failure, unspecified: Secondary | ICD-10-CM | POA: Diagnosis not present

## 2024-07-09 LAB — CREATININE, SERUM
Creatinine, Ser: 1.19 mg/dL (ref 0.61–1.24)
GFR, Estimated: >60 mL/min

## 2024-07-09 LAB — HIV ANTIBODY (ROUTINE TESTING W REFLEX): HIV Screen 4th Generation wRfx: NONREACTIVE

## 2024-07-09 LAB — CBC
HCT: 40.1 % (ref 39.0–52.0)
Hemoglobin: 13.6 g/dL (ref 13.0–17.0)
MCH: 30.8 pg (ref 26.0–34.0)
MCHC: 33.9 g/dL (ref 30.0–36.0)
MCV: 90.7 fL (ref 80.0–100.0)
Platelets: 165 K/uL (ref 150–400)
RBC: 4.42 MIL/uL (ref 4.22–5.81)
RDW: 13.8 % (ref 11.5–15.5)
WBC: 6.4 K/uL (ref 4.0–10.5)
nRBC: 0 % (ref 0.0–0.2)

## 2024-07-09 LAB — D-DIMER, QUANTITATIVE: D-Dimer, Quant: 0.33 ug{FEU}/mL (ref 0.00–0.50)

## 2024-07-09 LAB — BASIC METABOLIC PANEL WITH GFR
Anion gap: 12 (ref 5–15)
BUN: 24 mg/dL — ABNORMAL HIGH (ref 6–20)
CO2: 22 mmol/L (ref 22–32)
Calcium: 9 mg/dL (ref 8.9–10.3)
Chloride: 104 mmol/L (ref 98–111)
Creatinine, Ser: 1.13 mg/dL (ref 0.61–1.24)
GFR, Estimated: >60 mL/min
Glucose, Bld: 84 mg/dL (ref 70–99)
Potassium: 3.9 mmol/L (ref 3.5–5.1)
Sodium: 138 mmol/L (ref 135–145)

## 2024-07-09 LAB — PRO BRAIN NATRIURETIC PEPTIDE: Pro Brain Natriuretic Peptide: <50 pg/mL

## 2024-07-09 LAB — RESP PANEL BY RT-PCR (RSV, FLU A&B, COVID)  RVPGX2
Influenza A by PCR: NEGATIVE
Influenza B by PCR: NEGATIVE
Resp Syncytial Virus by PCR: NEGATIVE
SARS Coronavirus 2 by RT PCR: NEGATIVE

## 2024-07-09 LAB — TROPONIN T, HIGH SENSITIVITY
Troponin T High Sensitivity: 88 ng/L — ABNORMAL HIGH (ref 0–19)
Troponin T High Sensitivity: 78 ng/L — ABNORMAL HIGH (ref 0–19)

## 2024-07-09 MED ORDER — IPRATROPIUM-ALBUTEROL 0.5-2.5 (3) MG/3ML IN SOLN: 3.0000 mL | RESPIRATORY_TRACT | Status: DC | PRN

## 2024-07-09 MED ORDER — LOSARTAN POTASSIUM 50 MG PO TABS: 100.0000 mg | ORAL_TABLET | Freq: Every day | ORAL | Status: DC

## 2024-07-09 MED ORDER — TAMSULOSIN HCL 0.4 MG PO CAPS
0.4000 mg | ORAL_CAPSULE | Freq: Every day | ORAL | Status: DC
  Administered 2024-07-09 – 2024-07-10 (×2): 0.4 mg via ORAL
  Filled 2024-07-09 (×2): qty 1

## 2024-07-09 MED ORDER — ASPIRIN 81 MG PO TBEC
81.0000 mg | DELAYED_RELEASE_TABLET | Freq: Every day | ORAL | Status: DC
  Administered 2024-07-10: 81 mg via ORAL
  Filled 2024-07-09: qty 1

## 2024-07-09 MED ORDER — ASPIRIN 81 MG PO CHEW
324.0000 mg | CHEWABLE_TABLET | Freq: Once | ORAL | Status: AC
  Administered 2024-07-09: 324 mg via ORAL
  Filled 2024-07-09: qty 4

## 2024-07-09 MED ORDER — FLUTICASONE PROPIONATE 50 MCG/ACT NA SUSP: 1.0000 | Freq: Every day | NASAL | Status: DC | PRN

## 2024-07-09 MED ORDER — ATOMOXETINE HCL 25 MG PO CAPS
25.0000 mg | ORAL_CAPSULE | Freq: Every morning | ORAL | Status: DC
  Administered 2024-07-10: 25 mg via ORAL
  Filled 2024-07-09: qty 1

## 2024-07-09 MED ORDER — ROSUVASTATIN CALCIUM 20 MG PO TABS
20.0000 mg | ORAL_TABLET | Freq: Every day | ORAL | Status: DC
  Administered 2024-07-09 – 2024-07-10 (×2): 20 mg via ORAL
  Filled 2024-07-09 (×2): qty 1

## 2024-07-09 MED ORDER — LIDOCAINE 5 % EX PTCH
1.0000 | MEDICATED_PATCH | CUTANEOUS | Status: DC
  Filled 2024-07-09: qty 1

## 2024-07-09 MED ORDER — AMLODIPINE BESYLATE 10 MG PO TABS
10.0000 mg | ORAL_TABLET | Freq: Every day | ORAL | Status: DC
  Administered 2024-07-09 – 2024-07-10 (×2): 10 mg via ORAL
  Filled 2024-07-09 (×2): qty 1

## 2024-07-09 MED ORDER — FAMOTIDINE 20 MG PO TABS
20.0000 mg | ORAL_TABLET | Freq: Every day | ORAL | Status: DC
  Administered 2024-07-09 – 2024-07-10 (×2): 20 mg via ORAL
  Filled 2024-07-09 (×2): qty 1

## 2024-07-09 MED ORDER — IRBESARTAN 300 MG PO TABS
300.0000 mg | ORAL_TABLET | Freq: Every day | ORAL | Status: DC
  Administered 2024-07-09 – 2024-07-10 (×2): 300 mg via ORAL
  Filled 2024-07-09 (×2): qty 1

## 2024-07-09 MED ORDER — GUAIFENESIN ER 600 MG PO TB12
1200.0000 mg | ORAL_TABLET | Freq: Two times a day (BID) | ORAL | Status: DC
  Administered 2024-07-09 – 2024-07-10 (×2): 1200 mg via ORAL
  Filled 2024-07-09 (×2): qty 2

## 2024-07-09 MED ORDER — DIVALPROEX SODIUM ER 500 MG PO TB24
500.0000 mg | ORAL_TABLET | Freq: Two times a day (BID) | ORAL | Status: DC
  Administered 2024-07-09 – 2024-07-10 (×2): 500 mg via ORAL
  Filled 2024-07-09 (×3): qty 1

## 2024-07-09 MED ORDER — LORATADINE 10 MG PO TABS
10.0000 mg | ORAL_TABLET | Freq: Every evening | ORAL | Status: DC
  Administered 2024-07-09: 10 mg via ORAL
  Filled 2024-07-09: qty 1

## 2024-07-09 MED ORDER — ENOXAPARIN SODIUM 40 MG/0.4ML IJ SOSY
40.0000 mg | PREFILLED_SYRINGE | INTRAMUSCULAR | Status: DC
  Administered 2024-07-09: 40 mg via SUBCUTANEOUS
  Filled 2024-07-09: qty 0.4

## 2024-07-09 MED ORDER — HYDRALAZINE HCL 50 MG PO TABS
100.0000 mg | ORAL_TABLET | Freq: Two times a day (BID) | ORAL | Status: DC
  Administered 2024-07-09 – 2024-07-10 (×2): 100 mg via ORAL
  Filled 2024-07-09 (×2): qty 2

## 2024-07-09 MED ORDER — NITROGLYCERIN 0.4 MG SL SUBL
0.4000 mg | SUBLINGUAL_TABLET | SUBLINGUAL | Status: AC
  Administered 2024-07-09: 0.4 mg via SUBLINGUAL
  Filled 2024-07-09: qty 1

## 2024-07-09 MED ORDER — PANTOPRAZOLE SODIUM 40 MG PO TBEC
40.0000 mg | DELAYED_RELEASE_TABLET | Freq: Every day | ORAL | Status: DC
  Administered 2024-07-09 – 2024-07-10 (×2): 40 mg via ORAL
  Filled 2024-07-09 (×2): qty 1

## 2024-07-09 MED ORDER — SPIRONOLACTONE 25 MG PO TABS: 50.0000 mg | ORAL_TABLET | Freq: Every day | ORAL | Status: DC

## 2024-07-09 MED ORDER — ALBUTEROL SULFATE HFA 108 (90 BASE) MCG/ACT IN AERS: 1.0000 | INHALATION_SPRAY | Freq: Four times a day (QID) | RESPIRATORY_TRACT | Status: DC | PRN

## 2024-07-09 MED ORDER — FLUTICASONE FUROATE-VILANTEROL 100-25 MCG/ACT IN AEPB
1.0000 | INHALATION_SPRAY | Freq: Every day | RESPIRATORY_TRACT | Status: DC
  Administered 2024-07-09 – 2024-07-10 (×2): 1 via RESPIRATORY_TRACT
  Filled 2024-07-09: qty 28

## 2024-07-09 MED ORDER — DULOXETINE HCL 60 MG PO CPEP
60.0000 mg | ORAL_CAPSULE | Freq: Every day | ORAL | Status: DC
  Administered 2024-07-09 – 2024-07-10 (×2): 60 mg via ORAL
  Filled 2024-07-09 (×2): qty 1

## 2024-07-09 MED ORDER — EPLERENONE 25 MG PO TABS
25.0000 mg | ORAL_TABLET | Freq: Every day | ORAL | Status: DC
  Administered 2024-07-09 – 2024-07-10 (×2): 25 mg via ORAL
  Filled 2024-07-09 (×2): qty 1

## 2024-07-09 MED ORDER — HYDROCODONE-ACETAMINOPHEN 5-325 MG PO TABS
1.0000 | ORAL_TABLET | Freq: Once | ORAL | Status: AC
  Administered 2024-07-09: 1 via ORAL
  Filled 2024-07-09: qty 1

## 2024-07-09 MED ORDER — LEVOCETIRIZINE DIHYDROCHLORIDE 5 MG PO TABS: 5.0000 mg | ORAL_TABLET | Freq: Every evening | ORAL | Status: DC

## 2024-07-09 NOTE — Progress Notes (Signed)
 Chaplain responded to request from Day Chaplain to visit with patient. Chaplain found pt upbeat and welcoming. As we were together the pt described the varied things in his life that are stressful for him right now. Pt hopes the pain in his chest is a symptom of the stress and not anything wrong with his heart. Chaplain championed that belief with pt.   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  [place] Chaplain

## 2024-07-09 NOTE — Consult Note (Addendum)
 "  Cardiology Consultation   Patient ID: Jerry [name] MRN: [id number]; DOB: 1975/10/03  Admit date: 07/08/2024 Date of Consult: 07/09/2024  PCP:  Tammy Tari Jerry [name]   [facility] Providers Cardiologist:  Lurena MARLA Red, MD        Patient Profile: Jerry [name] is a 48 y.o. male with a hx of HTN, HLD, morbid obesity, chronic back pain, and ADHD on strattera who is being seen 07/09/2024 for the evaluation of chest pain at the request of Dr. Yolande.  History of Present Illness:  [name] with the above PMH previously saw Dr. Elmira with HTN. Echo 2020 showed LVEF 59% with grade 1 DD, and no significant valvular disease. Renal artery US  attempted but could not visualize due to body habitus.   HTN controlled with 10 mg amlodipine , 100 mg hydralazine  BID, 100 mg losartan , and 50 mg spironolactone .   Recent lipid panel with LDL 118.   He has not been seen by cardiology since 2020. He presented to [facility] 07/08/24 with chest tightness. Initial workup with BP 164/95, remains hypertensive today at 145/90, HR 67.   sCr 1.35 CBC WNL D-dimer negative proBNP < 50   HS troponin 75 --> 88 EKG with SR, iRBBB, LAFB  He received NTG x 1 at 1014 and norco 1253. He was loaded with 324 mg ASA.   Cardiology asked to evaluate for chest pain. He reports bronchitis and coughing started 2 weeks ago along with chest pain. He states chest pain only occurs with coughing. He reports chest pain 8/10 that was relieved with NTG this morning. CP radiating to right shoulder. No associated symptoms such as diaphoresis or N/V. No recent orthopnea, but does have LE edema.    He is sedentary and rides at a large grocery store due to back pain. He does not routinely exercise or walk outside of the house. 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   He states his wife is an [employer], but they have  not been routinely checking BP at home. His wife also reports that she thinks he has missed BP medications.   He married his wife six years ago and has been living with his wife and MIL. He reports increased stress with this living situation. He denies frequent alcohol use, and does not use THC products or illicit drugs. He reports that he quit smoking 2-3 months ago, has been smoking since 2003.    Past Medical History:  Diagnosis Date   Chronic back pain    Hypertension     Past Surgical History:  Procedure Laterality Date   ACHILLES TENDON SURGERY     LUMBAR LAMINECTOMY/DECOMPRESSION MICRODISCECTOMY Right 09/14/2020   Procedure: Microdiscectomy - right - L4-L5;  Surgeon: Louis Shove, MD;  Location: [facility] OR;  Service: Neurosurgery;  Laterality: Right;  3C   TOE SURGERY     TONSILLECTOMY       Home Medications:  Prior to Admission medications  Medication Sig Start Date End Date Taking? Authorizing Provider  albuterol  (VENTOLIN  HFA) 108 (90 Base) MCG/ACT inhaler Inhale 1-2 puffs into the lungs every 6 (six) hours as needed for wheezing or shortness of breath. 08/25/20  Yes Wieters, Hallie C, PA-C  amLODipine  (NORVASC ) 10 MG tablet Take 10 mg by mouth daily.   Yes [provider]  atomoxetine (STRATTERA) 25 MG capsule Take 25 mg by mouth every  morning.   Yes [provider]  divalproex  (DEPAKOTE  ER) 500 MG 24 hr tablet Take 500 mg by mouth 2 (two) times daily.   Yes [provider]  famotidine  (PEPCID ) 20 MG tablet Take 20 mg by mouth daily. 04/17/24 04/17/25 Yes [provider]  fluticasone  (FLONASE ) 50 MCG/ACT nasal spray Place 1 spray into both nostrils daily. Patient taking differently: Place 1 spray into both nostrils daily as needed for allergies or rhinitis. 07/11/20  Yes Hall-Potvin, Brittany, PA-C  hydrALAZINE  (APRESOLINE ) 100 MG tablet Take 100 mg by mouth 2 (two) times daily.    Yes [provider]  levocetirizine (XYZAL) 5 MG tablet Take 5  mg by mouth every evening. 03/24/24  Yes [provider]  losartan  (COZAAR ) 100 MG tablet Take 100 mg by mouth daily.   Yes [provider]  meloxicam (MOBIC) 15 MG tablet Take 15 mg by mouth daily as needed for pain. 05/17/24 07/16/24 Yes [provider]  pantoprazole  (PROTONIX ) 40 MG tablet Take 40 mg by mouth daily. 04/17/24 04/17/25 Yes [provider]  spironolactone  (ALDACTONE ) 50 MG tablet TAKE 1 TABLET(50 MG) BY MOUTH DAILY Patient taking differently: Take 50 mg by mouth daily. 12/11/19  Yes Patwardhan, Manish J, MD  tamsulosin  (FLOMAX ) 0.4 MG CAPS capsule Take 0.4 mg by mouth daily. 08/05/20  Yes [provider]  BREO ELLIPTA  100-25 MCG/ACT AEPB Inhale 1 puff into the lungs daily. Patient not taking: Reported on 07/09/2024    [provider]    Scheduled Meds:  amLODipine   10 mg Oral Daily   [START ON 07/10/2024] atomoxetine  25 mg Oral q morning   divalproex   500 mg Oral BID   enoxaparin  (LOVENOX ) injection  40 mg Subcutaneous Q24H   famotidine   20 mg Oral Daily   fluticasone  furoate-vilanterol  1 puff Inhalation Daily   guaiFENesin   1,200 mg Oral BID   hydrALAZINE   100 mg Oral BID   loratadine   10 mg Oral QPM   losartan   100 mg Oral Daily   pantoprazole   40 mg Oral Daily   spironolactone   50 mg Oral Daily   tamsulosin   0.4 mg Oral Daily   Continuous Infusions:  PRN Meds: fluticasone , ipratropium-albuterol   Allergies:   Allergies[1]  Social History:   Social History   Socioeconomic History   Marital status: Married    Spouse name: Not on file   Number of children: 0   Years of education: Not on file   Highest education level: Not on file  Occupational History   Not on file  Tobacco Use   Smoking status: Former    Types: Cigarettes   Smokeless tobacco: Never   Tobacco comments:    few a day   Vaping Use   Vaping status: Never Used  Substance and Sexual Activity   Alcohol use: Never   Drug use: Never   Sexual  activity: Yes  Other Topics Concern   Not on file  Social History Narrative   Not on file   Social Drivers of Health   Tobacco Use: Medium Risk (07/08/2024)   Patient History    Smoking Tobacco Use: Former    Smokeless Tobacco Use: Never    Passive Exposure: Not on file  Financial Resource Strain: Low Risk (10/18/2021)   Received from [facility]   Overall Financial Resource Strain (CARDIA)    Difficulty of Paying Living Expenses: Not hard at all  Food Insecurity: Low Risk (04/29/2024)   Received from [facility]   Epic    Within the past 12 months, you worried that your food would run out before you got money to buy more: Never true    Within the past 12 months, the food you bought just didn't last and you didn't have money to get more. : Never true  Transportation Needs: No Transportation Needs (04/29/2024)   Received from [facility]    In the past 12 months, has lack of reliable transportation kept you from medical appointments, meetings, work or from getting things needed for daily living? : No  Physical Activity: Unknown (10/18/2021)   Received from [facility] visits prior to 09/10/2022., [facility]   Exercise Vital Sign    On average, how many days per week do you engage in moderate to strenuous exercise (like a brisk walk)?: 3 days    Minutes of Exercise per Session: Not on file  Stress: No Stress Concern Present (10/18/2021)   Received from [facility], [facility] visits prior to 09/10/2022.   [employer] of Occupational Health - Occupational Stress Questionnaire    Feeling of Stress : Only a little  Social Connections: Socially Integrated (10/18/2021)   Received from [facility] visits prior to 09/10/2022., [facility]   Social Connection and Isolation Panel    In a typical week, how many times do you talk on the phone with family, friends, or neighbors?: More than three times  a week    How often do you get together with friends or relatives?: More than three times a week    How often do you attend church or religious services?: More than 4 times per year    Do you belong to any clubs or organizations such as church groups, unions, fraternal or athletic groups, or school groups?: Yes    How often do you attend meetings of the clubs or organizations you belong to?: More than 4 times per year    Are you married, widowed, divorced, separated, never married, or living with a partner?: Married  Intimate Partner Violence: Not At Risk (10/22/2022)   Humiliation, Afraid, Rape, and Kick questionnaire    Fear of Current or Ex-Partner: No    Emotionally Abused: No    Physically Abused: No    Sexually Abused: No  Depression (PHQ2-9): Not on file  Alcohol Screen: Not on file  Housing: Low Risk (04/29/2024)   Received from [facility]   Epic    What is your living situation today?: I have a steady place to live    Think about the place you live. Do you have problems with any of the following? Choose all that apply:: None/None on this list  Utilities: Low Risk (04/29/2024)   Received from [facility]   Utilities    In the past 12 months has the electric, gas, oil, or water company threatened to shut off services in your home? : No  Health Literacy: Not on file    Family History:    Family History  Problem Relation Age of Onset   Heart attack Maternal Grandmother    Heart attack Paternal Grandmother      ROS:  Please see the history of present illness.   All other ROS reviewed and negative.     Physical Exam/Data: Vitals:   07/09/24 0016 07/09/24 0426 07/09/24 0731 07/09/24 1314  BP: (!) 147/102 (!) 148/103 (!) 155/100 (!) 145/90  Pulse: 84 74  71 67  Resp: 18 20 17 16   Temp: 98 F (36.7 C) 98.6 F (37 C) 97.8 F (36.6 C) (!) 97.5 F (36.4 C)  TempSrc: Oral Oral  Oral  SpO2: 91% 98% 96% 100%   No intake or output data in the 24 hours ending 07/09/24  1557    01/02/2024    6:05 PM 07/04/2023   10:58 AM 10/20/2022    9:12 AM  Last 3 Weights  Weight (lbs) 295 lb 290 lb 265 lb  Weight (kg) 133.811 kg 131.543 kg 120.203 kg     There is no height or weight on file to calculate BMI.  General:  obese male in NAD HEENT: normal Neck: mild JVD Vascular: No carotid bruits; Distal pulses 2+ bilaterally Cardiac:  normal S1, S2; RRR; no murmur  Lungs:  clear to auscultation bilaterally, no wheezing, rhonchi or rales  Abd: soft, nontender, no hepatomegaly  Ext: 1+ pitting LE edema Musculoskeletal:  No deformities, BUE and BLE strength normal and equal Skin: warm and dry  Neuro:  CNs 2-12 intact, no focal abnormalities noted Psych:  Normal affect   EKG:  The EKG was personally reviewed and demonstrates:  SR with HR 92, iRBBB, LAFB, LVH Telemetry:  Telemetry was personally reviewed and demonstrates:  N/A  Relevant CV Studies:  Echo pending   Echo 04/2019: Left ventricle cavity is normal in size. Moderate concentric hypertrophy  of the left ventricle. Normal LV systolic function with EF 59%. Normal  global wall motion. Doppler evidence of grade I (impaired) diastolic  dysfunction, normal LAP.  No significant valvular abnormality.  Normal right atrial pressure.    Laboratory Data: High Sensitivity Troponin:  No results for input(s): TROPONINIHS in the last 720 hours.  Recent Labs  Lab 07/08/24 2115 07/09/24 1235  TRNPT 75* 88*      Chemistry Recent Labs  Lab 07/08/24 2115  NA 138  K 3.6  CL 104  CO2 21*  GLUCOSE 91  BUN 22*  CREATININE 1.35*  CALCIUM  9.2  GFRNONAA >60  ANIONGAP 13    No results for input(s): PROT, ALBUMIN, AST, ALT, ALKPHOS, BILITOT in the last 168 hours. Lipids No results for input(s): CHOL, TRIG, HDL, LABVLDL, LDLCALC, CHOLHDL in the last 168 hours.  Hematology Recent Labs  Lab 07/08/24 1955  WBC 8.3  RBC 4.67  HGB 14.3  HCT 43.1  MCV 92.3  MCH 30.6  MCHC 33.2   RDW 14.2  PLT 151   Thyroid No results for input(s): TSH, FREET4 in the last 168 hours.  BNP Recent Labs  Lab 07/09/24 1007  PROBNP <50.0    DDimer  Recent Labs  Lab 07/09/24 1007  DDIMER 0.33    Radiology/Studies:  DG Chest 2 View Result Date: 07/08/2024 CLINICAL DATA:  Chest pain.  Chest wall tightness on the right. EXAM: CHEST - 2 VIEW COMPARISON:  07/24/2023 FINDINGS: The cardiomediastinal contours are normal. Subsegmental left lung base atelectasis/scarring. Pulmonary vasculature is normal. No consolidation, pleural effusion, or pneumothorax. No acute osseous abnormalities are seen. IMPRESSION: Subsegmental left lung base atelectasis/scarring. Electronically Signed   By: Andrea Gasman M.D.   On: 07/08/2024 20:41     Assessment and Plan:  Chest pain - HST 75 --> 88 - EKG appears unchanged from prior with iRBBB, LAFB - loaded with 324 mg ASA - received NTG x 1 - somewhat atypical, I am able to elicit chest pain with palpation on exam - CP was relieved with NTG and norco -  CP not occurring prior to coughing, and is not present outside of coughing over the last 2 weeks - will start with echo, if echo abnormal, will proceed with R/L HC - if echo largely normal, would plan for CT coronary pending renal function tomorrow   Hypertension Hypertensive urgency - PTA: 10 mg amlodipine , 100 mg losartan , 100 mg hydralazine  BID, 50 mg spironolactone  - hypertensive in the ER - unclear if he has recently missed doses of medications - unclear BP control at home - recommend switching losartan  to 40 mg olmesartan, continue 10 mg amlodipine  - gynecomastia on exam,change 50 mg spironolactone  to 25 mg eplerenone   - remaining medication titration pending echo results   AKI - sCr 1.35, may be HTN urgency +/- dehydration - repeat BMP today   Hyperlipidemia with LDL goal < 70 Recent lipid panel 04/2024: Total chol 190 HDL 55 LDL 118 Trig 73 A1c 5.5% - will start 20 mg  crestor    Back pain - prior back surgery - now sedentary   Will order echo BMP tomorrow morning   Risk Assessment/Risk Scores:    TIMI Risk Score for Unstable Angina or Non-ST Elevation MI:   The patient's TIMI risk score is 3, which indicates a 13% risk of all cause mortality, new or recurrent myocardial infarction or need for urgent revascularization in the next 14 days.       For questions or updates, please contact [facility] Please consult [email or web address] for contact info under      Signed, Jon Nat Hails, PA  07/09/2024 3:57 PM   ATTENDING ATTESTATION:  After conducting a review of all available clinical information with the care team, interviewing the patient, and performing a physical exam, I agree with the findings and plan described in this note.   GEN: No acute distress, AO x 3 HEENT:  MMM, no JVD, no scleral icterus Cardiac: RRR, no murmurs, rubs, or gallops.  Respiratory: Relatively clear GI: Soft, obese, non-distended  MS: No edema; No deformity. Neuro:  Nonfocal  Vasc:  +2 radial pulses  The patient is a very pleasant 48 year old male with a history of hypertension, hyperlipidemia, and elevated BMI who is seen for chest pain.  The patient presents with relatively atypical chest pain associated more with coughing.  He is noted to be hypertensive.  His EKG demonstrated no ischemic changes.  He is currently chest pain-free.  Agree with plan above including optimization of blood pressure regimen.  Will obtain echocardiogram.  If echocardiogram is reassuring will proceed to coronary CTA as an ischemic evaluation.    Lurena Red, MD Pager [phone]     [1]  Allergies Allergen Reactions   Gabapentin Other (See Comments)    Change in status    "

## 2024-07-09 NOTE — Progress Notes (Incomplete)
 Internal Medicine Teaching Service Attending Note Date: 07/09/2024  Patient name: Jerry [name]  Medical record number: [id number]  Date of birth: [birth year]   I have seen and evaluated Jerry [name] and discussed their care with the Residency Team.   48 yo M with hx of bronchitis, HTN, comes to ED with episode of CP after episode of coughing. He states he could feel the pain across his chest and into his back. He denies diaphoresis, radiation into his arm.  In ED he was found to have SBP max 164 and DBP max 100.  He was given NTG, asa, norco and his pain resolved.  His Troponins were 75--> 88. His ECG showed incomplete RBBB, 92 bpm.   Physical Exam: Blood pressure (!) 145/90, pulse 67, temperature (!) 97.5 F (36.4 C), temperature source Oral, resp. rate 16, SpO2 100%. General appearance: alert, cooperative, and no distress Resp: clear to auscultation bilaterally Cardio: regular rate and rhythm GI: normal findings: bowel sounds normal and soft, non-tender Extremities: edema none  Lab results: Results for orders placed or performed during the hospital encounter of 07/08/24 (from the past 24 hours)  CBC     Status: None   Collection Time: 07/08/24  7:55 PM  Result Value Ref Range   WBC 8.3 4.0 - 10.5 K/uL   RBC 4.67 4.22 - 5.81 MIL/uL   Hemoglobin 14.3 13.0 - 17.0 g/dL   HCT 56.8 60.9 - 47.9 %   MCV 92.3 80.0 - 100.0 fL   MCH 30.6 26.0 - 34.0 pg   MCHC 33.2 30.0 - 36.0 g/dL   RDW 85.7 88.4 - 84.4 %   Platelets 151 150 - 400 K/uL   nRBC 0.0 0.0 - 0.2 %  Basic metabolic panel with GFR     Status: Abnormal   Collection Time: 07/08/24  9:15 PM  Result Value Ref Range   Sodium 138 135 - 145 mmol/L   Potassium 3.6 3.5 - 5.1 mmol/L   Chloride 104 98 - 111 mmol/L   CO2 21 (L) 22 - 32 mmol/L   Glucose, Bld 91 70 - 99 mg/dL   BUN 22 (H) 6 - 20 mg/dL   Creatinine, Ser 8.64 (H) 0.61 - 1.24 mg/dL   Calcium  9.2 8.9 - 10.3 mg/dL   GFR, Estimated >39 >39 mL/min   Anion gap 13 5  - 15  Troponin T, High Sensitivity     Status: Abnormal   Collection Time: 07/08/24  9:15 PM  Result Value Ref Range   Troponin T High Sensitivity 75 (H) 0 - 19 ng/L  Pro Brain natriuretic peptide     Status: None   Collection Time: 07/09/24 10:07 AM  Result Value Ref Range   Pro Brain Natriuretic Peptide <50.0 <300.0 pg/mL  D-dimer, quantitative     Status: None   Collection Time: 07/09/24 10:07 AM  Result Value Ref Range   D-Dimer, Quant 0.33 0.00 - 0.50 ug/mL-FEU  Troponin T, High Sensitivity     Status: Abnormal   Collection Time: 07/09/24 12:35 PM  Result Value Ref Range   Troponin T High Sensitivity 88 (H) 0 - 19 ng/L    Imaging results:  DG Chest 2 View Result Date: 07/08/2024 CLINICAL DATA:  Chest pain.  Chest wall tightness on the right. EXAM: CHEST - 2 VIEW COMPARISON:  07/24/2023 FINDINGS: The cardiomediastinal contours are normal. Subsegmental left lung base atelectasis/scarring. Pulmonary vasculature is normal. No consolidation, pleural effusion, or pneumothorax. No acute osseous abnormalities are seen.  IMPRESSION: Subsegmental left lung base atelectasis/scarring. Electronically Signed   By: Andrea Gasman M.D.   On: 07/08/2024 20:41    Assessment and Plan: I agree with the formulated Assessment and Plan with the following changes:  CP HTN Will continue to improve his BP control Will check TTE Will watch on tele Continue asa  Lipid panel Appreciate CV f/u.   Sciatica Pt asks for pain rx for his long standing sciatica.  Will manage.   Eben Reyes BROCKS, MD

## 2024-07-09 NOTE — H&P (Signed)
 " Date: 07/09/2024               Patient Name:  Jerry [name] MRN: [id number]  DOB: 30-Jan-1976 Age / Sex: 48 y.o., male   PCP: Tammy Tari [name]         Medical Service: Internal Medicine Teaching Service         Attending Physician: Dr. Eben Reyes BROCKS, MD      First Contact: Melvenia Morrison, MD  Pager:  [phone]  Second Contact: Dr. Lonni Africa, DO  Pager:  [phone]       After Hours  (After 5pm / First Contact Pager: [phone]  weekends / holidays): Second Contact Pager: [phone]   SUBJECTIVE   Chief Complaint: Chest pain  History of Present Illness: Jerry [name] is a 48 y.o. male with PMH of HTN, HLD, seizures, ADHD, GERD, tobacco use, and chronic back pain with right-sided sciatica who presents with around a week or so of intermittent chest pain.  He states that he has had what feels like bronchitis for the last several days and that he has been coughing nonstop.  He describes that he has pain associated with his cough that is pleuritic in nature not present at rest.  He first noticed his symptoms when he was out in the cold.  He does have tightness that occurs after he has done coughing and persist somewhat between periods of coughing.  Feels like the tightness radiates to his back as well.  He describes his pain as sharp, central.  He currently does not have any chest pain.  He states that this improved after receiving the aspirin  and the nitroglycerin .  Symptoms are not associated with activity.  This never happened to him before, although he does report that he has a history of chronic bronchitis.  He has tried some nasal spray but has not had any benefit.  He does have shortness of breath at baseline with activity but this has been occurring slowly over the past several years.  He denies nausea, vomiting, abdominal pain.  He does report that his legs are swollen and his wife notices this from time to time.  He reports no decreased urinary output but has  not drank a lot while in the ED.  He does not take the inhalers that he is prescribed for his asthma.   ED Course: Vitals were notable for BP of 164/95, tachycardia to 101. Otherwise normal vital signs. Labs significant for mild elevation in creatinine to 1.35. Troponin elevation at 77 then 88 15 hours later. D-Dimer and proBNP normal. Chest x-ray demonstrated subsegmental scarring at the left lung base. Received Aspirin  324mg  and Nitroglycerin  in the ED. Also received Norco. Consulted cardiology whose recommendations are pending  Meds:  Taking but did not take today. Amlodipine  10mg  daily Atomoxetine 25mg  daily Depakote  500mg  BID Pepcid  20mg  daily Fluticasone  daily PRN Hydralazine  100mg  BID Xyzal 5mg  every evening Losartan  100mg  daily Protonix  40mg  daily Spironolactone  50mg  daily Tamsulosin  0.4 mg daily  Not taking Breo and Albuterol  Active Medications[1]  Past Medical History Past Medical History:  Diagnosis Date   Chronic back pain    Hypertension   Sleep apnea ADHD GERD BPH Seizures  Past Surgical History Past Surgical History:  Procedure Laterality Date   ACHILLES TENDON SURGERY     LUMBAR LAMINECTOMY/DECOMPRESSION MICRODISCECTOMY Right 09/14/2020   Procedure: Microdiscectomy - right - L4-L5;  Surgeon: Louis Shove, MD;  Location: [facility] OR;  Service: Neurosurgery;  Laterality: Right;  3C  TOE SURGERY     TONSILLECTOMY      Social:  Lives With: Patients wife Occupation: Optician, Dispensing Support: Family members Level of Function: independent PCP: Tammy Rasmussen T, PA-C Substances: -Tobacco: Current smoker, differing answers. Reports occasional cigarette use now and reports that he never heavily smoked -Alcohol: Socia -Recreational Drug: Denies  Family History:  Family History  Problem Relation Age of Onset   Heart attack Maternal Grandmother    Heart attack Paternal Grandmother     Allergies: Allergies as of 07/08/2024 - Review Complete 07/08/2024   Allergen Reaction Noted   Gabapentin Other (See Comments) 03/07/2020    Review of Systems: A complete ROS was negative except as per HPI.   OBJECTIVE:   Physical Exam: Blood pressure (!) 145/90, pulse 67, temperature (!) 97.5 F (36.4 C), temperature source Oral, resp. rate 16, SpO2 100%.  Constitutional: alert, well-appearing, sitting in bed, in no acute distress HENT: normocephalic atraumatic, mucous membranes dry Eyes: conjunctiva non-erythematous Cardiovascular: regular rate and rhythm, no m/r/g Pulmonary/Chest: normal work of breathing on room air, Expiratory wheeze over the left lower lung base Abdominal: bowel sounds present, soft, non-tender, non-distended Skin: warm and dry, 1+ edema bilaterally Psych: Mildly anxious  Labs: CBC    Component Value Date/Time   WBC 6.4 07/09/2024 1612   RBC 4.42 07/09/2024 1612   HGB 13.6 07/09/2024 1612   HCT 40.1 07/09/2024 1612   PLT 165 07/09/2024 1612   MCV 90.7 07/09/2024 1612   MCH 30.8 07/09/2024 1612   MCHC 33.9 07/09/2024 1612   RDW 13.8 07/09/2024 1612   LYMPHSABS 1.5 10/20/2022 1133   MONOABS 0.4 10/20/2022 1133   EOSABS 0.2 10/20/2022 1133   BASOSABS 0.1 10/20/2022 1133     CMP     Component Value Date/Time   NA 138 07/08/2024 2115   NA 138 04/26/2019 1548   K 3.6 07/08/2024 2115   CL 104 07/08/2024 2115   CO2 21 (L) 07/08/2024 2115   GLUCOSE 91 07/08/2024 2115   BUN 22 (H) 07/08/2024 2115   BUN 20 04/26/2019 1548   CREATININE 1.35 (H) 07/08/2024 2115   CALCIUM  9.2 07/08/2024 2115   PROT 6.2 (L) 01/02/2024 1930   ALBUMIN 2.8 (L) 01/02/2024 1930   AST 15 01/02/2024 1930   ALT 16 01/02/2024 1930   ALKPHOS 37 (L) 01/02/2024 1930   BILITOT 0.7 01/02/2024 1930   GFRNONAA >60 07/08/2024 2115   GFRAA >60 06/07/2019 1226    Latest Reference Range & Units 07/08/24 21:15 07/09/24 10:07 07/09/24 12:35  Pro Brain Natriuretic Peptide <300.0 pg/mL  <50.0   Troponin T High Sensitivity 0 - 19 ng/L 75 (H)  88 (H)   (H): Data is abnormally high   Latest Reference Range & Units 07/09/24 10:07  D-Dimer, Quant 0.00 - 0.50 ug/mL-FEU 0.33    Imaging:  DG Chest 2 View CLINICAL DATA:  Chest pain.  Chest wall tightness on the right.  EXAM: CHEST - 2 VIEW  COMPARISON:  07/24/2023  FINDINGS: The cardiomediastinal contours are normal. Subsegmental left lung base atelectasis/scarring. Pulmonary vasculature is normal. No consolidation, pleural effusion, or pneumothorax. No acute osseous abnormalities are seen.  IMPRESSION: Subsegmental left lung base atelectasis/scarring.  Electronically Signed   By: Andrea Gasman M.D.   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  ASSESSMENT & PLAN:   Assessment & Plan by Problem: Principal Problem:  Acute chest pain Active Problems:   Sciatica   Tennis NICANOR MENDOLIA is a 48 y.o. person living with a history of HTN, HLD, seizures, ADHD, GERD, tobacco use, and chronic back pain with right-sided sciatica who presents with around a week or so of intermittent chest pain, admitted for ACS  #NSTEMI #Chest pain Symptoms are not completely consistent with anginal symptoms.  He does have chest tightness but he describes that this radiates to his back and not his arms.  His chest pain is described as sharp and pleuritic in nature and occurs when he is coughing.  His symptoms been present over the last 2 weeks.  They are not at exacerbated with exertion.  He does state that his symptoms are exacerbated by the cold. Troponins elevated but flat at 75 to 88 15 hours later. TIMI score 2 for risk factors and troponins. Loaded with aspirin  in the ED. Received nitroglycerin  and is currently chest pain free. The quality of his pain and his wheezing on physical exam suggests that there may be a pulmonary component involved. 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 Last LDL 118 on 10/08.  -Cardiology consulted, recommending echo and subsequent CTA if echo reassuring. -Trend troponin to peak -EKG if recurrent chest pain -TTE for WMA -Telemetry -Aspirin  81mg  daily -PRN nitroglycerin  for chest pain -Start Rosuvastatin  20mg  daily  #Asthma #Bronchitis Patient non-adherent to inhaler regimen at home. It is possible that some of his chest pain may be secondary to an asthma exacerbation. He is currently chest pain free. Did have some wheezing on exam.  -Continue home regimen of Breo Ellipta  daily and Albuterol  PRN -Continue allergy treatment with fluticasone  and Loratadine  -RVP -Mucinex   #HTN BP elevated on admission to 165/95. Home regimen Amlodipine  10mg  daily, Hydralazine  100mg  BID, Losartan  100mg  daily, and Spironolactone  50mg  daily  -Continue Hydralazine  100mg  BID -Change Losartan  to Irbesartan  300mg  -Change Sprinolactone to Eplerenone  25mg  daily -Continue Amlodipine  10mg   #AKI Elevated serum creatinine to 1.35. Suspect prerenal AKI in the setting of not drinking enough while in the ED.  -Trend BMP -Encourage oral hydration  #Hx of seizures -Continue Keppra 500mg  BID  #Sciatica Presented with chronic lower pack pain with sciatica. Has tried gabapentin in the past with adverse reaction. Takes Meloxicam PRN at home. Received Norco in the ED with benefit.  -Lidocaine  -Cymbalta  60mg   #GERD -Continue home Famotidine  20mg  and Protonix  40mg  daily  #OSA Mild per a sleep study in 2020. Was given a CPAP but has not been wearing this.  #ADHD -Continue Atomoxitine  #BPH -Continue Flomax   Diet: Normal VTE: Enoxaparin  IVF: None Code: Full Surrogate Decision Maker: Wife  Prior to Admission Living Arrangement: Home, living with wife Anticipated Discharge Location: Home Barriers to Discharge: Further workup  Dispo: Admit patient  to Observation with expected length of stay less than 2 midnights.  Signed: Napoleon Limes, MD Internal Medicine Resident PGY-1 07/09/2024, 4:59 PM   Please contact IM Residency On-Call Pager at: [phone] or [phone].      [1]  Current Meds  Medication Sig   albuterol  (VENTOLIN  HFA) 108 (90 Base) MCG/ACT inhaler Inhale 1-2 puffs into the lungs every 6 (six) hours as needed for wheezing or shortness of breath.   amLODipine  (NORVASC ) 10 MG tablet Take 10 mg by mouth daily.   atomoxetine (STRATTERA) 25 MG capsule Take 25 mg by mouth every morning.   divalproex  (DEPAKOTE  ER)  500 MG 24 hr tablet Take 500 mg by mouth 2 (two) times daily.   famotidine  (PEPCID ) 20 MG tablet Take 20 mg by mouth daily.   fluticasone  (FLONASE ) 50 MCG/ACT nasal spray Place 1 spray into both nostrils daily. (Patient taking differently: Place 1 spray into both nostrils daily as needed for allergies or rhinitis.)   hydrALAZINE  (APRESOLINE ) 100 MG tablet Take 100 mg by mouth 2 (two) times daily.    levocetirizine (XYZAL) 5 MG tablet Take 5 mg by mouth every evening.   losartan  (COZAAR ) 100 MG tablet Take 100 mg by mouth daily.   meloxicam (MOBIC) 15 MG tablet Take 15 mg by mouth daily as needed for pain.   pantoprazole  (PROTONIX ) 40 MG tablet Take 40 mg by mouth daily.   spironolactone  (ALDACTONE ) 50 MG tablet TAKE 1 TABLET(50 MG) BY MOUTH DAILY (Patient taking differently: Take 50 mg by mouth daily.)   tamsulosin  (FLOMAX ) 0.4 MG CAPS capsule Take 0.4 mg by mouth daily.   "

## 2024-07-09 NOTE — Hospital Course (Addendum)
 Admit high risk chest pain - uptrending troponins, aspirin /nitroglycerin /CP gone Hx HTN No known cardiac history Had SOB Get echo, cath? LE edema Normal dimer and BNP   Admitted 07/08/2024  Allergies: Gabapentin Pertinent Hx: HTN, asthma, sciatica  48 y.o. male p/w Chest pain  *Atypical Chest pain - history sounds pleuritic but troponin leak. Not ACS. Cards to get ECHO and maybe CT coronaries  Consults: cards  VTE ppx: lovenox   Diet: Reg  * Deseray Daponte *  []    O/N events:   *Please do not leave messages for physician in this sticky note. Please page appropriate physician as below.*   Contact Name/Pager for Internal Medicine Residents:   First Contact:    Pager: [phone]  Second Contact:   Pager: [phone]

## 2024-07-09 NOTE — ED Provider Notes (Signed)
 " [place] [facility] Provider Note   CSN: [id number] Arrival date & time: 07/08/24  1510     Patient presents with: No chief complaint on file.   Jerry [name] is a 48 y.o. male.   48 year old male history of hypertension, hyperlipidemia, asthma, and prior tobacco use who presents to the emergency department chest tightness.  Patient reports that over the weekend he started developing substernal chest tightness.  Occasionally feels sharp.'s pleuritic.  Not exertional.  No diaphoresis or vomiting.  Also has had a productive cough with mild headache.  Denies fevers or chills.  No hemoptysis.  Also has noticed some bilateral lower extremity swelling recently.  Does have a longstanding history of sciatica and says for several months his right leg has been bothering him.  He has electrical shock and burning sensations that go from his back down to his toes.  No bowel or bladder incontinence.  No saddle anesthesia.  He is not on blood thinners.  No history of cancer.  Has had back surgery several years ago.  Denies any cardiac history       Prior to Admission medications  Medication Sig Start Date End Date Taking? Authorizing Provider  albuterol  (VENTOLIN  HFA) 108 (90 Base) MCG/ACT inhaler Inhale 1-2 puffs into the lungs every 6 (six) hours as needed for wheezing or shortness of breath. 08/25/20  Yes Wieters, Hallie C, PA-C  amLODipine  (NORVASC ) 10 MG tablet Take 10 mg by mouth daily.   Yes [provider]  atomoxetine (STRATTERA) 25 MG capsule Take 25 mg by mouth every morning.   Yes [provider]  divalproex  (DEPAKOTE  ER) 500 MG 24 hr tablet Take 500 mg by mouth 2 (two) times daily.   Yes [provider]  famotidine  (PEPCID ) 20 MG tablet Take 20 mg by mouth daily. 04/17/24 04/17/25 Yes [provider]  fluticasone  (FLONASE ) 50 MCG/ACT nasal spray Place 1 spray into both nostrils daily. Patient taking differently:  Place 1 spray into both nostrils daily as needed for allergies or rhinitis. 07/11/20  Yes Hall-Potvin, Brittany, PA-C  hydrALAZINE  (APRESOLINE ) 100 MG tablet Take 100 mg by mouth 2 (two) times daily.    Yes [provider]  levocetirizine (XYZAL) 5 MG tablet Take 5 mg by mouth every evening. 03/24/24  Yes [provider]  losartan  (COZAAR ) 100 MG tablet Take 100 mg by mouth daily.   Yes [provider]  meloxicam (MOBIC) 15 MG tablet Take 15 mg by mouth daily as needed for pain. 05/17/24 07/16/24 Yes [provider]  pantoprazole  (PROTONIX ) 40 MG tablet Take 40 mg by mouth daily. 04/17/24 04/17/25 Yes [provider]  spironolactone  (ALDACTONE ) 50 MG tablet TAKE 1 TABLET(50 MG) BY MOUTH DAILY Patient taking differently: Take 50 mg by mouth daily. 12/11/19  Yes Patwardhan, Manish J, MD  tamsulosin  (FLOMAX ) 0.4 MG CAPS capsule Take 0.4 mg by mouth daily. 08/05/20  Yes [provider]  BREO ELLIPTA  100-25 MCG/ACT AEPB Inhale 1 puff into the lungs daily. Patient not taking: Reported on 07/09/2024    [provider]    Allergies: Gabapentin    Review of Systems  Updated Vital Signs BP (!) 163/109 (BP Location: Right Arm)   Pulse 70   Temp 97.7 F (36.5 C) (Oral)   Resp 20   SpO2 100%   Physical Exam Vitals and nursing note reviewed.  Constitutional:      General: He is not in acute distress.  Appearance: He is well-developed.  HENT:     Head: Normocephalic and atraumatic.     Right Ear: External ear normal.     Left Ear: External ear normal.     Nose: Nose normal.  Eyes:     Extraocular Movements: Extraocular movements intact.     Conjunctiva/sclera: Conjunctivae normal.     Pupils: Pupils are equal, round, and reactive to light.  Cardiovascular:     Rate and Rhythm: Normal rate and regular rhythm.     Heart sounds: Normal heart sounds.  Pulmonary:     Effort: Pulmonary effort is normal. No respiratory distress.     Breath  sounds: Normal breath sounds.  Musculoskeletal:     Cervical back: Normal range of motion and neck supple.     Right lower leg: Edema (1+) present.     Left lower leg: Edema (1+) present.     Comments: No spinal midline TTP in cervical, thoracic, or lumbar spine. No stepoffs noted.   Motor: Muscle bulk and tone are normal. Strength is 5/5 in hip flexion, knee flexion and extension, ankle dorsiflexion and plantar flexion bilaterally. Full strength of great toe dorsiflexion bilaterally.  Sensory: Intact sensation to light touch in L2 though S1 dermatomes bilaterally.    Skin:    General: Skin is warm and dry.  Neurological:     Mental Status: He is alert. Mental status is at baseline.  Psychiatric:        Mood and Affect: Mood normal.        Behavior: Behavior normal.     (all labs ordered are listed, but only abnormal results are displayed) Labs Reviewed  BASIC METABOLIC PANEL WITH GFR - Abnormal; Notable for the following components:      Result Value   CO2 21 (*)    BUN 22 (*)    Creatinine, Ser 1.35 (*)    All other components within normal limits  TROPONIN T, HIGH SENSITIVITY - Abnormal; Notable for the following components:   Troponin T High Sensitivity 75 (*)    All other components within normal limits  TROPONIN T, HIGH SENSITIVITY - Abnormal; Notable for the following components:   Troponin T High Sensitivity 88 (*)    All other components within normal limits  TROPONIN T, HIGH SENSITIVITY - Abnormal; Notable for the following components:   Troponin T High Sensitivity 78 (*)    All other components within normal limits  RESP PANEL BY RT-PCR (RSV, FLU A&B, COVID)  RVPGX2  CBC  PRO BRAIN NATRIURETIC PEPTIDE  D-DIMER, QUANTITATIVE  HIV ANTIBODY (ROUTINE TESTING W REFLEX)  CBC  CREATININE, SERUM  BASIC METABOLIC PANEL WITH GFR  CBC  BASIC METABOLIC PANEL WITH GFR    EKG: EKG Interpretation Date/Time:  Monday July 08 2024 18:03:19 EST Ventricular Rate:   92 PR Interval:  162 QRS Duration:  112 QT Interval:  378 QTC Calculation: 467 R Axis:   -57  Text Interpretation: Normal sinus rhythm Incomplete right bundle branch block Left anterior fascicular block Moderate voltage criteria for LVH, may be normal variant ( R in aVL , Cornell product ) Abnormal ECG When compared with ECG of 24-Jul-2023 14:40, PREVIOUS ECG IS PRESENT Confirmed by Yolande Charleston [phone]) on 07/09/2024 8:17:19 AM  Radiology: DG Chest 2 View Result Date: 07/08/2024 CLINICAL DATA:  Chest pain.  Chest wall tightness on the right. EXAM: CHEST - 2 VIEW COMPARISON:  07/24/2023 FINDINGS: The cardiomediastinal contours are normal. Subsegmental left lung base atelectasis/scarring. Pulmonary  vasculature is normal. No consolidation, pleural effusion, or pneumothorax. No acute osseous abnormalities are seen. IMPRESSION: Subsegmental left lung base atelectasis/scarring. Electronically Signed   By: Andrea Gasman M.D.   On: 07/08/2024 20:41     Procedures   Medications Ordered in the ED  enoxaparin  (LOVENOX ) injection 40 mg (has no administration in time range)  divalproex  (DEPAKOTE  ER) 24 hr tablet 500 mg (500 mg Oral Given 07/09/24 1741)  atomoxetine (STRATTERA) capsule 25 mg (has no administration in time range)  amLODipine  (NORVASC ) tablet 10 mg (10 mg Oral Given 07/09/24 1743)  fluticasone  furoate-vilanterol (BREO ELLIPTA ) 100-25 MCG/ACT 1 puff (1 puff Inhalation Given 07/09/24 1740)  famotidine  (PEPCID ) tablet 20 mg (20 mg Oral Given 07/09/24 1746)  hydrALAZINE  (APRESOLINE ) tablet 100 mg (100 mg Oral Given 07/09/24 1745)  fluticasone  (FLONASE ) 50 MCG/ACT nasal spray 1 spray (has no administration in time range)  pantoprazole  (PROTONIX ) EC tablet 40 mg (40 mg Oral Given 07/09/24 1741)  tamsulosin  (FLOMAX ) capsule 0.4 mg (0.4 mg Oral Given 07/09/24 1741)  ipratropium-albuterol  (DUONEB) 0.5-2.5 (3) MG/3ML nebulizer solution 3 mL (has no administration in time range)   guaiFENesin  (MUCINEX ) 12 hr tablet 1,200 mg (1,200 mg Oral Given 07/09/24 1742)  loratadine  (CLARITIN ) tablet 10 mg (10 mg Oral Given 07/09/24 1743)  irbesartan  (AVAPRO ) tablet 300 mg (300 mg Oral Given 07/09/24 1744)  eplerenone  (INSPRA ) tablet 25 mg (25 mg Oral Given 07/09/24 1747)  rosuvastatin  (CRESTOR ) tablet 20 mg (20 mg Oral Given 07/09/24 1743)  aspirin  EC tablet 81 mg (has no administration in time range)  DULoxetine  (CYMBALTA ) DR capsule 60 mg (60 mg Oral Given 07/09/24 1742)  lidocaine  (LIDODERM ) 5 % 1 patch (1 patch Transdermal Not Given 07/09/24 1809)  aspirin  chewable tablet 324 mg (324 mg Oral Given 07/09/24 1014)  nitroGLYCERIN  (NITROSTAT ) SL tablet 0.4 mg (0.4 mg Sublingual Given 07/09/24 1014)  HYDROcodone -acetaminophen  (NORCO/VICODIN) 5-325 MG per tablet 1 tablet (1 tablet Oral Given 07/09/24 1253)    Clinical Course as of 07/09/24 1939  Tue Jul 09, 2024  1416 Discussed with Dr. Bernett from internal medicine teaching service for admission.  Trish from cardiology is going to send someone to evaluate the patient in consultation for his chest pain and elevated troponin [RP]    Clinical Course User Index [RP] Yolande Lamar BROCKS, MD                                 Medical Decision Making Amount and/or Complexity of Data Reviewed Labs: ordered.  Risk OTC drugs. Prescription drug management. Decision regarding hospitalization.   Jerry [name] is a 48 year old male history of hypertension, hyperlipidemia, asthma, and prior tobacco use who presents to the emergency department chest tightness.   Initial Ddx:  MI, PE, CHF, myocarditis, demand ischemia, URI, pneumonia, DVT/PE, sciatica, cauda equina  MDM/Course:  Patient presents to the emergency department with chest tightness.  Appears to be having some respiratory infectious symptoms as well.  Also is reporting some leg pain that he attributes to sciatica.  On exam he is not in acute distress.  He satting well  on room air.  His lung sounds are clear to auscultation bilaterally.  Does have mild lower extremity edema bilaterally.  Peers to be neurovascular intact in his legs so low concern for spinal cord compression and do believe this is due to his sciatica.  CBC and CMP remarkable for creatinine of 1.35.  This is slightly  up from his baseline.  D-dimer WNL.  BNP WNL.  Chest x-ray pulmonary edema or pneumonia.  EKG showed an incomplete right bundle branch block with LVH.  Serial troponins elevated at 75 and 88.  He was given aspirin  and nitroglycerin  reported upon re-evaluation that his chest pain had resolved.  Unclear if this is demand ischemia or could be from primary cardiac insult from an MI or potentially even myocarditis.  Given his risk factors we will admit and have cardiology see him to see if any additional provocative testing is warranted  This patient presents to the ED for concern of complaints listed in HPI, this involves an extensive number of treatment options, and is a complaint that carries with it a high risk of complications and morbidity. Disposition including potential need for admission considered.   Dispo: Admit to Floor  I have reviewed the patients home medications and made adjustments as needed Additional history obtained from spouse Records reviewed Outpatient Clinic Notes The following labs were independently interpreted: Serial Troponins and show elevated but flat not reflective of acute MI I independently reviewed the following imaging with scope of interpretation limited to determining acute life threatening conditions related to emergency care: Chest x-ray and agree with the radiologist interpretation with the following exceptions: none I personally reviewed and interpreted the pt's EKG: see above for interpretation  Consults: Cardiology  Portions of this note were generated with Dragon dictation software. Dictation errors may occur despite best attempts at proofreading.      Final diagnoses:  Chest pain, unspecified type  Elevated troponin    ED Discharge Orders     None          Yolande Lamar BROCKS, MD 07/09/24 1939  "

## 2024-07-09 NOTE — ED Notes (Signed)
 Assisted patient with reporting work absence through his work administrator.

## 2024-07-09 NOTE — Progress Notes (Signed)
 Chaplain paged by ED to visit pt in hallway. Before chaplain responded, notified that pt being moved to 4E22. Chaplain attempted to see pt in his room, but admission procedures were taking place.  Referred to oncall chaplain [facility].  Alan HERO. Davee Lomax, M.Div. [facility] Chaplain Pager [phone] Office [phone]

## 2024-07-09 NOTE — ED Notes (Signed)
 Turkey sandwich and gingerale provided.

## 2024-07-10 ENCOUNTER — Other Ambulatory Visit (HOSPITAL_COMMUNITY): Payer: Self-pay

## 2024-07-10 ENCOUNTER — Observation Stay (HOSPITAL_BASED_OUTPATIENT_CLINIC_OR_DEPARTMENT_OTHER)

## 2024-07-10 DIAGNOSIS — G4733 Obstructive sleep apnea (adult) (pediatric): Secondary | ICD-10-CM | POA: Diagnosis not present

## 2024-07-10 DIAGNOSIS — R0789 Other chest pain: Secondary | ICD-10-CM | POA: Diagnosis not present

## 2024-07-10 DIAGNOSIS — Z79899 Other long term (current) drug therapy: Secondary | ICD-10-CM | POA: Diagnosis not present

## 2024-07-10 DIAGNOSIS — R7989 Other specified abnormal findings of blood chemistry: Secondary | ICD-10-CM

## 2024-07-10 DIAGNOSIS — N400 Enlarged prostate without lower urinary tract symptoms: Secondary | ICD-10-CM | POA: Diagnosis not present

## 2024-07-10 DIAGNOSIS — I3481 Nonrheumatic mitral (valve) annulus calcification: Secondary | ICD-10-CM | POA: Diagnosis not present

## 2024-07-10 DIAGNOSIS — N179 Acute kidney failure, unspecified: Secondary | ICD-10-CM | POA: Diagnosis not present

## 2024-07-10 DIAGNOSIS — I10 Essential (primary) hypertension: Secondary | ICD-10-CM | POA: Diagnosis not present

## 2024-07-10 DIAGNOSIS — R079 Chest pain, unspecified: Secondary | ICD-10-CM | POA: Diagnosis not present

## 2024-07-10 DIAGNOSIS — Z8669 Personal history of other diseases of the nervous system and sense organs: Secondary | ICD-10-CM | POA: Diagnosis not present

## 2024-07-10 DIAGNOSIS — K219 Gastro-esophageal reflux disease without esophagitis: Secondary | ICD-10-CM | POA: Diagnosis not present

## 2024-07-10 DIAGNOSIS — I160 Hypertensive urgency: Secondary | ICD-10-CM

## 2024-07-10 DIAGNOSIS — M5430 Sciatica, unspecified side: Secondary | ICD-10-CM | POA: Diagnosis not present

## 2024-07-10 DIAGNOSIS — F909 Attention-deficit hyperactivity disorder, unspecified type: Secondary | ICD-10-CM | POA: Diagnosis not present

## 2024-07-10 DIAGNOSIS — J45901 Unspecified asthma with (acute) exacerbation: Secondary | ICD-10-CM | POA: Diagnosis not present

## 2024-07-10 LAB — CBC
HCT: 40.6 % (ref 39.0–52.0)
Hemoglobin: 13.9 g/dL (ref 13.0–17.0)
MCH: 30.3 pg (ref 26.0–34.0)
MCHC: 34.2 g/dL (ref 30.0–36.0)
MCV: 88.5 fL (ref 80.0–100.0)
Platelets: 170 K/uL (ref 150–400)
RBC: 4.59 MIL/uL (ref 4.22–5.81)
RDW: 13.6 % (ref 11.5–15.5)
WBC: 6.8 K/uL (ref 4.0–10.5)
nRBC: 0 % (ref 0.0–0.2)

## 2024-07-10 LAB — BASIC METABOLIC PANEL WITH GFR
Anion gap: 10 (ref 5–15)
BUN: 23 mg/dL — ABNORMAL HIGH (ref 6–20)
CO2: 22 mmol/L (ref 22–32)
Calcium: 9 mg/dL (ref 8.9–10.3)
Chloride: 104 mmol/L (ref 98–111)
Creatinine, Ser: 1.15 mg/dL (ref 0.61–1.24)
GFR, Estimated: >60 mL/min
Glucose, Bld: 100 mg/dL — ABNORMAL HIGH (ref 70–99)
Potassium: 4 mmol/L (ref 3.5–5.1)
Sodium: 137 mmol/L (ref 135–145)

## 2024-07-10 LAB — ECHOCARDIOGRAM COMPLETE
Area-P 1/2: 3.85 cm2
Height: 66 in
Weight: 4721.6 [oz_av]

## 2024-07-10 MED ORDER — PERFLUTREN LIPID MICROSPHERE
1.0000 mL | INTRAVENOUS | Status: AC | PRN
  Administered 2024-07-10: 3 mL via INTRAVENOUS

## 2024-07-10 MED ORDER — EPLERENONE 25 MG PO TABS
25.0000 mg | ORAL_TABLET | Freq: Every day | ORAL | 0 refills | Status: AC
  Filled 2024-07-10: qty 90, 90d supply, fill #0

## 2024-07-10 MED ORDER — ROSUVASTATIN CALCIUM 20 MG PO TABS
20.0000 mg | ORAL_TABLET | Freq: Every day | ORAL | 0 refills | Status: AC
  Filled 2024-07-10: qty 90, 90d supply, fill #0

## 2024-07-10 MED ORDER — ASPIRIN 81 MG PO TBEC
81.0000 mg | DELAYED_RELEASE_TABLET | Freq: Every day | ORAL | 12 refills | Status: AC
  Filled 2024-07-10: qty 30, 30d supply, fill #0

## 2024-07-10 MED ORDER — CARVEDILOL 3.125 MG PO TABS
3.1250 mg | ORAL_TABLET | Freq: Two times a day (BID) | ORAL | 0 refills | Status: AC
  Filled 2024-07-10: qty 180, 90d supply, fill #0

## 2024-07-10 MED ORDER — IRBESARTAN 300 MG PO TABS
300.0000 mg | ORAL_TABLET | Freq: Every day | ORAL | 0 refills | Status: AC
  Filled 2024-07-10: qty 90, 90d supply, fill #0

## 2024-07-10 MED ORDER — CARVEDILOL 3.125 MG PO TABS: 3.1250 mg | ORAL_TABLET | Freq: Two times a day (BID) | ORAL | Status: DC

## 2024-07-10 MED ORDER — ENOXAPARIN SODIUM 60 MG/0.6ML IJ SOSY: 60.0000 mg | PREFILLED_SYRINGE | INTRAMUSCULAR | Status: DC

## 2024-07-10 NOTE — Discharge Instructions (Addendum)
 Mr Jerry [name], Jerry [name] were hospitalized for chest pain. The most likely cause of your chest pain is from your underlying bronchitis and asthma and should go away on its own as inflammation clears. Chest pain from the lungs is usually sharp and worse with breathing and responds to medicines like tylenol . Chest pain from the heart is usually pressure-like and worse with activity and does not responds to over-the-counter pain medicines. While here, you were evaluated to ensure your heart is healthy. Based on your workup, you did not have a heart attack. You heart ultrasound looked good. The final test to do is a scan of your heart arteries, which can safely be done out of the hospital.   Some medications were changed for better blood pressure control and long-term heart health: Stop losartan  and spironolactone  Start irbesartan  and eplerenone  and carvedilol.  Continue your aspirin  and rosuvastatin  and other medicines as prescribed.  Your cardiology team will set up an appointment in the coming days. They should call you. If you do not hear from them, contact [facility] at [phone]. Your hospital cardiologist was Dr. Lurena Red. Tell them that you need a post-hospitalization appointment.  We wish you the best.

## 2024-07-10 NOTE — Progress Notes (Signed)
 Echocardiogram 2D Echocardiogram has been performed.  Juliene JINNY Rucks 07/10/2024, 9:56 AM

## 2024-07-10 NOTE — Progress Notes (Signed)
 Explained discharge instructions to patient. Reviewed follow up appointment and next medication administration times. Also reviewed education. Patient verbalized having an understanding for instructions given. All belongings are in the patient's possession to include TOC meds. IV and telemetry were removed. CCMD was notified. No other needs verbalized. Transporting to the discharge lounge to await his ride.

## 2024-07-10 NOTE — Discharge Summary (Signed)
 "  Name: DAMARIO GILLIE MRN: [id number] DOB: 12-05-1975 48 y.o. PCP: Tammy Tari [name]  Date of Admission: 07/08/2024  5:49 PM Date of Discharge: 07/10/2024 Attending Physician: Dr. Reyes Fenton  Discharge Diagnosis: 1. Principal Problem:   Acute chest pain Active Problems:   Sciatica   Elevated troponin   Discharge Medications: Allergies as of 07/10/2024       Reactions   Gabapentin Other (See Comments)   Change in status         Medication List     STOP taking these medications    losartan  100 MG tablet Commonly known as: COZAAR    spironolactone  50 MG tablet Commonly known as: ALDACTONE        TAKE these medications    albuterol  108 (90 Base) MCG/ACT inhaler Commonly known as: VENTOLIN  HFA Inhale 1-2 puffs into the lungs every 6 (six) hours as needed for wheezing or shortness of breath.   amLODipine  10 MG tablet Commonly known as: NORVASC  Take 10 mg by mouth daily.   aspirin  EC 81 MG tablet Take 1 tablet (81 mg total) by mouth daily. Swallow whole. Start taking on: July 11, 2024   atomoxetine 25 MG capsule Commonly known as: STRATTERA Take 25 mg by mouth every morning.   Breo Ellipta  100-25 MCG/ACT Aepb Generic drug: fluticasone  furoate-vilanterol Inhale 1 puff into the lungs daily.   carvedilol 3.125 MG tablet Commonly known as: COREG Take 1 tablet (3.125 mg total) by mouth 2 (two) times daily with a meal.   divalproex  500 MG 24 hr tablet Commonly known as: DEPAKOTE  ER Take 500 mg by mouth 2 (two) times daily.   eplerenone  25 MG tablet Commonly known as: INSPRA  Take 1 tablet (25 mg total) by mouth daily. Start taking on: July 11, 2024   famotidine  20 MG tablet Commonly known as: PEPCID  Take 20 mg by mouth daily.   fluticasone  50 MCG/ACT nasal spray Commonly known as: FLONASE  Place 1 spray into both nostrils daily. What changed:  when to take this reasons to take this   hydrALAZINE  100 MG tablet Commonly known  as: APRESOLINE  Take 100 mg by mouth 2 (two) times daily.   irbesartan  300 MG tablet Commonly known as: AVAPRO  Take 1 tablet (300 mg total) by mouth daily. Start taking on: July 11, 2024   levocetirizine 5 MG tablet Commonly known as: XYZAL Take 5 mg by mouth every evening.   meloxicam 15 MG tablet Commonly known as: MOBIC Take 15 mg by mouth daily as needed for pain.   pantoprazole  40 MG tablet Commonly known as: PROTONIX  Take 40 mg by mouth daily.   rosuvastatin  20 MG tablet Commonly known as: CRESTOR  Take 1 tablet (20 mg total) by mouth daily. Start taking on: July 11, 2024   tamsulosin  0.4 MG Caps capsule Commonly known as: FLOMAX  Take 0.4 mg by mouth daily.        Disposition and follow-up:   Mr.Kameron KENARD MORAWSKI was discharged from [facility] in Good condition.  At the hospital follow up visit please address:  1.  Chest pain  - Admitted with atypical chest pain and elevated troponins. Echo with diastolic dysfunction  - Evaluate for continued symptoms  - Encourage adherence with asthma and antihypertensive medications  - Ensure follow-up with cardiology and completed coronary CTA  2. Hypertension  - Admitted with elevated blood pressure  - Discharged on a new regimen of carvedilol 3.125 mg twice daily, eplerenone  25 mg daily, irbesartan  300 mg  daily, hydralazine  100 mg twice daily, and amlodipine  10 mg daily  - Consider discontinuing amlodipine  if he has persistent lower leg edema  - Encourage continued CPAP adherence  - Ensure the patient has stopped taking spironolactone  and losartan  and is adherent to the above regimen.  3.  Asthma  - Patient previously nonadherent to his Breo Ellipta , encouraged adherence  - Please continue to encourage adherence to asthma medications, as it is possible that this contributed to his chest pain.  4. AKI  - Admitted with very mild AKI  - Check BMP to ensure resolution and establish baseline  5.  Labs  / imaging needed at time of follow-up: BMP  6.  Pending labs/ test needing follow-up: Scheduling coronary CTA  Follow-up Appointments:  Follow-up Information     Tammy Rasmussen T, PA-C Follow up in 2 week(s).   Specialty: Physician Assistant Contact information: [address] [place] [address] [phone]         Thukkani, Arun K, MD. Call in 2 week(s).   Specialty: Cardiology Why: Call if you do not hear anything in the next 2 weeks Contact information: [address] [place] [address] [phone]                  Hospital Course by problem list: Osher WOODROE VOGAN is a 48 y.o. person living with a history of HTN, asthma, HLD, seizures, ADHD, GERD, tobacco use, and chronic back pain with right-sided sciatica who presents with 2 weeks of intermittent, pleuritic chest pain, admitted for atypical chest pain.   #Atypical Chest pain Presented with chest tightness and pleuritic pain with coughing for the last 2 weeks. He did have chest tightness that radiated to his back and not his arms.  His chest pain was described as sharp and pleuritic in nature and occured when he is coughing.  Not consistent with anginal or typical chest pain. Troponins mildly elevated and flat in the 70s and 80s. TIMI score 2 for risk factors and troponins. Loaded with aspirin  in the ED. Received nitroglycerin  and remained chest pain free since the day of admission. The quality of his pain and his wheezing on physical exam suggests that there may be a pulmonary component involved. EKG was non-ischemic. Last LDL 118 on 10/08.  Echocardiogram performed which showed preserved ejection fraction, no regional wall motion abnormalities, but did show diastolic dysfunction.  Of note this study was very limited.  Cardiology were consulted and recommend a outpatient CTA, which they will arrange for the patient.  Patient was started on aspirin  81 mg daily.   #HTN urgency BP elevated on  admission to 165/95. Possible that the patient's chest pain and elevated troponin due to uncontrolled hypertension at home. Home regimen Amlodipine  10mg  daily, Hydralazine  100mg  BID, Losartan  100mg  daily, and Spironolactone  50mg  daily. Spironolactone  switched to Eplerenone  because of gynecomastia. Losartan  changed to Irbesartan  per Cardiology.  Cardiology added Coreg 3.125 mg twice daily on the day of discharge for additional blood pressure control.  If the patient continues to have swelling in his legs, would consider stopping the amlodipine  to see if this improves the swelling.  #Asthma #Bronchitis  Patient non-adherent to inhaler regimen at home. It is possible that some of his chest pain may be secondary to an asthma exacerbation. He is currently chest pain free. Did have some wheezing on initial exam.  Restarted on his home Breo Ellipta  and albuterol  as needed.     #Sciatica Presented with chronic lower pack  pain with sciatica. Has tried gabapentin in the past with adverse reaction. Takes Meloxicam PRN at home. Received Norco in the ED with benefit. Pain controled today with initiation of Lidocaine  patches and Cymbalta .  Will not continue Cymbalta  on discharge, but this may be a good medication for the patient in the future.   #AKI Elevated serum creatinine to 1.35 on admission.  Baseline 0.95. Suspect prerenal AKI in the setting of not drinking enough while in the ED. Improved to 1.15 on the morning of discharge.   Chronic conditions managed with home regimen   #Hx of seizures -Continue Keppra 500mg  BID   #GERD -Continue home Famotidine  20mg  and Protonix  40mg  daily   #OSA -Encouraged continued CPAP use   #ADHD -Continue Atomoxitine   #BPH -Continue Flomax     Discharge Subjective Remains chest pain free. No complaints. Reports that he has been taking Amlodipine  for 2 years and has had peripheral swelling for the last year.   Discharge Exam:   BP (!) 145/81 (BP Location: Right  Wrist)   Pulse 69   Temp 97.6 F (36.4 C) (Oral)   Resp 19   Ht 5' 6 (1.676 m)   Wt 133.9 kg   SpO2 98%   BMI 47.63 kg/m  Discharge exam:   Physical Exam Constitutional:      General: He is not in acute distress.    Appearance: He is not ill-appearing.  Cardiovascular:     Rate and Rhythm: Normal rate and regular rhythm.     Heart sounds: No murmur heard.    No friction rub. No gallop.  Pulmonary:     Effort: Pulmonary effort is normal.     Breath sounds: Normal breath sounds. No wheezing, rhonchi or rales.  Abdominal:     General: Abdomen is flat. There is no distension.     Tenderness: There is no abdominal tenderness.  Musculoskeletal:     Right lower leg: Edema present.     Left lower leg: Edema present.     Comments: 1+ peripheral edema bilaterally  Neurological:     Mental Status: He is alert.   Pertinent Labs, Studies, and Procedures:     Latest Ref Rng & Units 07/10/2024    5:02 AM 07/09/2024    4:12 PM 07/08/2024    7:55 PM  CBC  WBC 4.0 - 10.5 K/uL 6.8  6.4  8.3   Hemoglobin 13.0 - 17.0 g/dL 86.0  86.3  85.6   Hematocrit 39.0 - 52.0 % 40.6  40.1  43.1   Platelets 150 - 400 K/uL 170  165  151        Latest Ref Rng & Units 07/10/2024    5:02 AM 07/09/2024    7:05 PM 07/09/2024    4:12 PM  CMP  Glucose 70 - 99 mg/dL 899  84    BUN 6 - 20 mg/dL 23  24    Creatinine 9.38 - 1.24 mg/dL 8.84  8.86  8.80   Sodium 135 - 145 mmol/L 137  138    Potassium 3.5 - 5.1 mmol/L 4.0  3.9    Chloride 98 - 111 mmol/L 104  104    CO2 22 - 32 mmol/L 22  22    Calcium  8.9 - 10.3 mg/dL 9.0  9.0      DG Chest 2 View Result Date: 07/08/2024 CLINICAL DATA:  Chest pain.  Chest wall tightness on the right. EXAM: CHEST - 2 VIEW COMPARISON:  07/24/2023 FINDINGS: The cardiomediastinal  contours are normal. Subsegmental left lung base atelectasis/scarring. Pulmonary vasculature is normal. No consolidation, pleural effusion, or pneumothorax. No acute osseous abnormalities are  seen. IMPRESSION: Subsegmental left lung base atelectasis/scarring. Electronically Signed   By: Andrea Gasman M.D.   On: 07/08/2024 20:41     Discharge Instructions: Discharge Instructions     Call MD for:  difficulty breathing, headache or visual disturbances   Complete by: As directed    Call MD for:  extreme fatigue   Complete by: As directed    Call MD for:  persistant dizziness or light-headedness   Complete by: As directed    Call MD for:  persistant nausea and vomiting   Complete by: As directed    Call MD for:  temperature >100.4   Complete by: As directed    Diet - low sodium heart healthy   Complete by: As directed    Increase activity slowly   Complete by: As directed       You were hospitalized for chest pain. The most likely cause of your chest pain is from your underlying bronchitis and asthma and should go away on its own as inflammation clears. Chest pain from the lungs is usually sharp and worse with breathing and responds to medicines like tylenol . Chest pain from the heart is usually pressure-like and worse with activity and does not responds to over-the-counter pain medicines. While here, you were evaluated to ensure your heart is healthy. Based on your workup, you did not have a heart attack. You heart ultrasound looked good. The final test to do is a scan of your heart arteries, which can safely be done out of the hospital.    Some medications were changed for better blood pressure control and long-term heart health: Stop losartan  and spironolactone  Start irbesartan  and eplerenone  and carvedilol.  Continue your aspirin  and rosuvastatin  and other medicines as prescribed.   Your cardiology team will set up an appointment in the coming days. They should call you. If you do not hear from them, contact [facility] at [phone]. Your hospital cardiologist was Dr. Lurena Red. Tell them that you need a post-hospitalization appointment.   We wish you  the best.  Signed: Napoleon Limes, MD 07/10/2024, 11:30 AM       "

## 2024-07-10 NOTE — Progress Notes (Signed)
 "  HD#0 SUBJECTIVE:  Patient Summary: Jerry [name] is a 48 y.o. with a pertinent PMH of HTN, HLD, seizures, ADHD, GERD, tobacco use, and chronic back pain with right-sided sciatica who presents with around a week or so of intermittent chest pain, admitted for atypical chest pain.  Overnight Events: None  Interim History: Remains chest pain free. No complaints. Reports that he has been taking Amlodipine  for 2 years and has had peripheral swelling for the last year.  Seen by chaplain who offered spiritual support.  Seen by cardiology, who are planning an echo followed by a coronary workup which is yet to be determined depending on the result.   OBJECTIVE:  Vital Signs: Vitals:   07/09/24 1642 07/09/24 1942 07/10/24 0019 07/10/24 0513  BP: (!) 163/109 (!) 150/80 121/63 (!) 157/88  Pulse: 70 88 77 74  Resp: 20 17 17 13   Temp: 97.7 F (36.5 C) 97.8 F (36.6 C) (!) 97.4 F (36.3 C) 97.6 F (36.4 C)  TempSrc: Oral Oral Oral Oral  SpO2: 100% 97% 97% 94%   Supplemental O2: Room Air SpO2: 94 %  There were no vitals filed for this visit.    Physical Exam: Physical Exam Constitutional:      General: He is not in acute distress.    Appearance: He is not ill-appearing.  Cardiovascular:     Rate and Rhythm: Normal rate and regular rhythm.     Heart sounds: No murmur heard.    No friction rub. No gallop.  Pulmonary:     Effort: Pulmonary effort is normal.     Breath sounds: Normal breath sounds. No wheezing, rhonchi or rales.  Abdominal:     General: Abdomen is flat. There is no distension.     Tenderness: There is no abdominal tenderness.  Musculoskeletal:     Right lower leg: Edema present.     Left lower leg: Edema present.     Comments: 1+ peripheral edema bilaterally  Neurological:     Mental Status: He is alert.      Patient Lines/Drains/Airways Status     Active Line/Drains/Airways     Name Placement date Placement time Site Days   Peripheral IV 07/09/24  20 G Left;Lateral Antecubital 07/09/24  1001  Antecubital  1            Pertinent labs and imaging:      Latest Ref Rng & Units 07/10/2024    5:02 AM 07/09/2024    4:12 PM 07/08/2024    7:55 PM  CBC  WBC 4.0 - 10.5 K/uL 6.8  6.4  8.3   Hemoglobin 13.0 - 17.0 g/dL 86.0  86.3  85.6   Hematocrit 39.0 - 52.0 % 40.6  40.1  43.1   Platelets 150 - 400 K/uL 170  165  151        Latest Ref Rng & Units 07/09/2024    7:05 PM 07/09/2024    4:12 PM 07/08/2024    9:15 PM  CMP  Glucose 70 - 99 mg/dL 84   91   BUN 6 - 20 mg/dL 24   22   Creatinine 9.38 - 1.24 mg/dL 8.86  8.80  8.64   Sodium 135 - 145 mmol/L 138   138   Potassium 3.5 - 5.1 mmol/L 3.9   3.6   Chloride 98 - 111 mmol/L 104   104   CO2 22 - 32 mmol/L 22   21   Calcium  8.9 - 10.3 mg/dL 9.0  9.2     No results found.  ASSESSMENT/PLAN:  Assessment: Principal Problem:   Acute chest pain Active Problems:   Sciatica  Jerry [name] is a 48 y.o. person living with a history of HTN, HLD, seizures, ADHD, GERD, tobacco use, and chronic back pain with right-sided sciatica who presents with around a week or so of intermittent chest pain, admitted for atypical chest pain  Plan: #NSTEMI  #Atypical Chest pain Symptoms are not consistent with anginal symptoms.  He does have chest tightness but he describes that this radiates to his back and not his arms.  His chest pain is described as sharp and pleuritic in nature and occurs when he is coughing.  His symptoms been present over the last 2 weeks.  They were not at exacerbated with exertion. Troponins peaked at 88. TIMI score 2 for risk factors and troponins. Loaded with aspirin  in the ED. Received nitroglycerin  and has been chest pain free since the day of admission. The quality of his pain and his wheezing on physical exam suggests that there may be a pulmonary component involved. EKG was non-ischemic Last LDL 118 on 10/08.   -Cardiology consulted, recommending echo and  subsequent CTA if echo reassuring. -EKG if recurrent chest pain -TTE for WMA this morning -Telemetry -Aspirin  81mg  daily -PRN nitroglycerin  for chest pain -Continue Rosuvastatin  20mg  daily   #Asthma #Bronchitis  Patient non-adherent to inhaler regimen at home. It is possible that some of his chest pain may be secondary to an asthma exacerbation. He is currently chest pain free. Did have some wheezing on initial exam   -Continue home regimen of Breo Ellipta  daily and Albuterol  PRN -Continue allergy treatment with fluticasone  and Loratadine  -RVP yet to be collected -Mucinex    #Sciatica Presented with chronic lower pack pain with sciatica. Has tried gabapentin in the past with adverse reaction. Takes Meloxicam PRN at home. Received Norco in the ED with benefit. Pain is better controled today with initiation of Lidocaine  patches and Cymbalta .   -Lidocaine  patches -Cymbalta  60mg  daily   #HTN urgency BP elevated on admission to 165/95. Possible that the patient's chest pain and elevated troponin due to uncontrolled hypertension at home. Home regimen Amlodipine  10mg  daily, Hydralazine  100mg  BID, Losartan  100mg  daily, and Spironolactone  50mg  daily. Remains elevated at 157/88 this morning. Spironolactone  switched to Eplerenone  because of gynecomastia. Losartan  changed to Irbesartan  per Cardiology. If echocardiogram normal, would consider stopping amlodipine  to see if this improves his lower leg swelling   -Continue Hydralazine  100mg  BID -Continue Irbesartan  300mg  -Continue Eplerenone  25mg  daily -Continue Amlodipine  10mg , potentially could stop this medication if no other cause for peripheral swelling is identified   #AKI Elevated serum creatinine to 1.35 on admission. Suspect prerenal AKI in the setting of not drinking enough while in the ED. Improved to 1.15 this morning.   -Trend BMP -Encourage oral hydration   #Hx of seizures -Continue Keppra 500mg  BID   #GERD -Continue home  Famotidine  20mg  and Protonix  40mg  daily   #OSA Mild per a sleep study in 2020. Was given a CPAP but has not been wearing this.   #ADHD -Continue Atomoxitine   #BPH -Continue Flomax     Best Practice: Diet: Regular diet VTE: Lovenox  60mg  daily Code: Full  Disposition planning: Family Contact: Wife, to be notified. DISPO: Anticipated discharge today or tomorrow to Home pending Echo and coronary evaluation per cardiology.  Signature:  Melvenia Morrison, MD [facility], PGY-1 5:49 AM, 07/10/2024  On Call pager [phone]   "

## 2024-07-10 NOTE — Progress Notes (Addendum)
 "  Progress Note  Patient Name: Jerry [name] Date of Encounter: 07/10/2024 [facility] Cardiologist: Tasheema Perrone K Daniesha Driver, MD   Interval Summary    Feeling much better today.   Vital Signs Vitals:   07/09/24 1942 07/10/24 0019 07/10/24 0513 07/10/24 0743  BP: (!) 150/80 121/63 (!) 157/88 (!) 145/81  Pulse: 88 77 74 69  Resp: 17 17 13 19   Temp: 97.8 F (36.6 C) (!) 97.4 F (36.3 C) 97.6 F (36.4 C) 97.6 F (36.4 C)  TempSrc: Oral Oral Oral Oral  SpO2: 97% 97% 94% 98%  Weight:    133.9 kg  Height:    5' 6 (1.676 m)   No intake or output data in the 24 hours ending 07/10/24 0955    07/10/2024    7:43 AM 01/02/2024    6:05 PM 07/04/2023   10:58 AM  Last 3 Weights  Weight (lbs) 295 lb 1.6 oz 295 lb 290 lb  Weight (kg) 133.856 kg 133.811 kg 131.543 kg      Telemetry/ECG   Sinus Rhythm 80-90, intermittently tachycardic - Personally Reviewed  Physical Exam  GEN: No acute distress.   Neck: No JVD Cardiac: RRR, no murmurs, rubs, or gallops.  Respiratory: Clear to auscultation bilaterally. GI: Soft, nontender, non-distended  MS: No edema  Assessment & Plan   48 y.o. male with a hx of HTN, HLD, morbid obesity, chronic back pain, and ADHD on strattera who was seen 07/09/2024 for the evaluation of chest pain at the request of Dr. Yolande.   Chest pain -- hsTn 75> 88>78, EKG unchanged from prior. Chest pain seemed somewhat atypical on exam as he was tender to palpation according to notes yesterday. Feels much better this morning, no further chest pain.  -- TTE today with preserved LV function -- ASA 81 -- rosuvastatin  20   Hypertension Hypertensive urgency -- PTA meds: 10 mg amlodipine , 100 mg losartan , 100 mg hydralazine  BID, 50 mg spironolactone  -- was hypertensive in the ER, reported he missed some of his BP meds -- improved today, on irbesartan  300mg  daily, norvasc  10mg  daily, hydralazine  100mg  BID and eplerenone  25mg  daily. Add coreg 3.125mg  BID     AKI -- sCr 1.35 on admission, improved to 1.15   Hyperlipidemia with LDL goal < 70 -- lipid panel 04/2024: HDL 55, LDL 118 -- now on 20 mg crestor    Back pain -- prior back surgery   For questions or updates, please contact [facility] Please consult [email or web address] for contact info under   Signed, Manuelita Rummer, NP    ATTENDING ATTESTATION:  After conducting a review of all available clinical information with the care team, interviewing the patient, and performing a physical exam, I agree with the findings and plan described in this note with adjustments as indicated below which were discussed and enacted by staff above.   GEN: No acute distress, AO x 3 HEENT:  MMM, no JVD, no scleral icterus Cardiac: RRR, no murmurs, rubs, or gallops.  Respiratory: Clear to auscultation bilaterally. GI: Soft, obese, nontender MS: No edema; No deformity. Neuro:  Nonfocal  Vasc:  +2 radial pulses  Patient doing well today without recurrent chest pain.  His chest pain was atypical and TTP.  TTE today with preserved LV function.  Continue current therapy and add low dose Coreg for improved BP control (also HR control for coronary CTA).  OK for discharge and will arrange outpatient coronary CTA.  Will sign off, call with ?s.  Sibbie Flammia  Wendel, MD Pager [phone]   "

## 2024-07-10 NOTE — Plan of Care (Signed)

## 2024-07-10 NOTE — Care Management Obs Status (Signed)
 MEDICARE OBSERVATION STATUS NOTIFICATION   Patient Details  Name: Jerry [name] MRN: [id number] Date of Birth: 01/10/1976   Medicare Observation Status Notification Given:  Yes    Vonzell Arrie Sharps 07/10/2024, 11:21 AM

## 2024-07-15 NOTE — Progress Notes (Signed)
 "  [facility]  - Family Medicine Myra Master  Date of service:  07/15/2024  Name:  Jerry [name]  Date of Birth:  September 05, 1975    SUBJECTIVE   Patient ID: Jerry [name] is a 49 y.o. (DOB Oct 15, 1975) male   Chief Complaint  Patient presents with   Hospital Follow-Up    [facility] of recent discharge from [facility].  Date of admission: 07/08/2024 Date of discharge: 07/10/2024  Reason for admission: Acute chest pain  Discharge summary reviewed. Home health: No  Pt states he has been feeling better since leaving the hospital. States he feels his chest pain was due to stress and coughing. He has been taking all of his meds as prescribed. Pt stating that his sciatic nerve is bothering him and wants to go to PT and a Toradol  shot.  Hospital Discharge Summary: Jerry [name] is a 49 y.o. person living with a history of HTN, asthma, HLD, seizures, ADHD, GERD, tobacco use, and chronic back pain with right-sided sciatica who presents with 2 weeks of intermittent, pleuritic chest pain, admitted for atypical chest pain.    #Atypical Chest pain Presented with chest tightness and pleuritic pain with coughing for the last 2 weeks. He did have chest tightness that radiated to his back and not his arms.  His chest pain was described as sharp and pleuritic in nature and occured when he is coughing.  Not consistent with anginal or typical chest pain. Troponins mildly elevated and flat in the 70s and 80s. TIMI score 2 for risk factors and troponins. Loaded with aspirin  in the ED. Received nitroglycerin  and remained chest pain free since the day of admission. The quality of his pain and his wheezing on physical exam suggests that there may be a pulmonary component involved. EKG was non-ischemic. Last LDL 118 on 10/08.  Echocardiogram performed which showed preserved ejection fraction, no regional wall motion abnormalities, but did show  diastolic dysfunction.  Of note this study was very limited.  Cardiology were consulted and recommend a outpatient CTA, which they will arrange for the patient.  Patient was started on aspirin  81 mg daily.   #HTN urgency BP elevated on admission to 165/95. Possible that the patient's chest pain and elevated troponin due to uncontrolled hypertension at home. Home regimen Amlodipine  10mg  daily, Hydralazine  100mg  BID, Losartan  100mg  daily, and Spironolactone  50mg  daily. Spironolactone  switched to Eplerenone  because of gynecomastia. Losartan  changed to Irbesartan  per Cardiology.  Cardiology added Coreg  3.125 mg twice daily on the day of discharge for additional blood pressure control.  If the patient continues to have swelling in his legs, would consider stopping the amlodipine  to see if this improves the swelling.   #Asthma #Bronchitis  Patient non-adherent to inhaler regimen at home. It is possible that some of his chest pain may be secondary to an asthma exacerbation. He is currently chest pain free. Did have some wheezing on initial exam.  Restarted on his home Breo Ellipta  and albuterol  as needed.     #Sciatica Presented with chronic lower pack pain with sciatica. Has tried gabapentin in the past with adverse reaction. Takes Meloxicam PRN at home. Received Norco in the ED with benefit. Pain controled today with initiation of Lidocaine  patches and Cymbalta .  Will not continue Cymbalta  on discharge, but this may be a good medication for the patient in the future.    #AKI Elevated serum creatinine to 1.35 on admission.  Baseline 0.95. Suspect prerenal AKI in the setting of not drinking enough while in the ED. Improved to 1.15 on the morning of discharge.   Chronic conditions managed with home regimen   #Hx of seizures -Continue Keppra 500mg  BID   #GERD -Continue home Famotidine  20mg  and Protonix  40mg  daily   #OSA -Encouraged continued CPAP use   #ADHD -Continue Atomoxitine    #BPH -Continue Flomax    Review of Systems Review of Systems  Respiratory:  Negative for cough and shortness of breath.   Cardiovascular:  Negative for chest pain.  Gastrointestinal:  Negative for abdominal pain.  Musculoskeletal:  Negative for back pain.  Neurological:  Negative for headaches.     Social History[1]   The following portions of the patient's history were reviewed and updated as appropriate: allergies, current medications, past family history, past medical history, past social history, past surgical history and problem list.  OBJECTIVE   Vitals:   07/15/24 1035  BP: 118/72  Pulse: 80  Temp: 97.2 F (36.2 C)  SpO2: 97%    Constitutional: Alert - NAD. Appears well-developed and well nourished. HEENT: Normocephalic, atraumatic.  EOMI, PERRL Neck: No cervical adenopathy. No thyromegaly.  Cardiovascular: Normal rate and rhythm.  Exam reveals no gallop and no friction rub.  No murmur heard.  Pulmonary/chest: Effort normal and breath sounds normal. No wheezes. No rales.  Skin: Warm and dry. No rashes noted. Patient is not diaphoretic. Extremities: No edema bilaterally. No clubbing/cyanosis.   ASSESSMENT/PLAN   Problem List Items Addressed This Visit       Cardiovascular and Mediastinum   Essential hypertension     Nervous and Auditory   Lumbar radiculopathy   Relevant Medications   ketorolac  (TORADOL ) injection 30 mg (Completed)   Other Relevant Orders   Ambulatory Referral to Orthopedics     Other   Morbid obesity with body mass index (BMI) of 45.0 to 49.9 in adult (CMD)   Other Visit Diagnoses       Hospital discharge follow-up    -  Primary      -BP back to normal. Continue all meds -Reviewed lab work - Toradol  shot given. Ref to Ortho - Follow up as scheduled  Medications prescribed or ordered upon discharge were reviewed on 07/15/2024  and reconciled with the most recent outpatient medication list. I have reviewed and agree with this  medication reconciliation.   Return if symptoms worsen or fail to improve.  Current Outpatient Medications  Medication Instructions   albuterol  HFA (PROVENTIL  HFA;VENTOLIN  HFA;PROAIR  HFA) 90 mcg/actuation inhaler 2 puffs, inhalation, Every 6 hours PRN   amLODIPine  (NORVASC ) 10 mg, oral, Daily   aspirin  81 mg, Daily   atoMOXetine  (STRATTERA ) 25 mg, Daily   azelastine (ASTELIN) 137 mcg (0.1 %) nasal spray 1 spray, Each Nostril, 2 times daily, Use in each nostril as directed, for at least 4 weeks   Breo Ellipta  100-25 mcg/dose inhaler    carvedilol  (COREG ) 3.125 mg   cetirizine  (ZYRTEC ) 10 mg, oral, Daily   divalproex  (DEPAKOTE ) 500 mg, At bedtime   eplerenone  (INSPRA ) 25 mg, Daily   famotidine  (PEPCID ) 20 mg, oral, 2 times daily   fluocinolone acetonide oiL 0.01 % drop Apply 1-2 drops into the affected (itching ear canal) daily as needed   fluticasone  propionate (FLONASE ) 50 mcg/spray nasal spray 1 spray, Each Nostril, Daily   hydrALAZINE  (APRESOLINE ) 100 mg, oral, 3 times daily   irbesartan  (AVAPRO ) 300 mg, Daily   levocetirizine (XYZAL ) 5 mg, oral, Every evening  meloxicam (MOBIC) 15 mg, oral, Daily PRN   pantoprazole  (PROTONIX ) 40 mg, oral, Every morning before breakfast   rosuvastatin  (CRESTOR ) 20 mg, Daily   tamsulosin  (FLOMAX ) 0.4 mg, oral, Daily     Risks, benefits, and alternatives of the medication(s) and treatment plan(s) were discussed, and he expressed understanding. Plan follow up as discussed or as needed. No barriers to treatment identified in this visit.      I agree the documentation is accurate and complete.  Electronically signed by: Delon Crooked, CMA 07/15/2024 8:16 AM     This document was created using the aid of voice recognition Dragon dictation software.    This document serves as a record of services personally performed by Tari Banter PA-C.  It was created on their behalf by Delon Crooked, CMA, a trained medical  scribe, and Certified Medical Assistant (CMA). During the course of documenting the history, physical exam and medical decision making, I was functioning as a stage manager. The creation of this record is the providers dictation and/or activities during the visit.  Electronically signed by Delon Crooked, CMA 07/15/2024 10:09 AM  I agree the documentation is accurate and complete.  Electronically signed by: Tari ONEIDA Banter, PA-C 07/15/2024 1:54 PM         [1] Social History Tobacco Use   Smoking status: Former    Current packs/day: 0.00    Types: Cigarettes    Start date: 07/11/2014    Quit date: 07/12/2015    Years since quitting: 9.0   Smokeless tobacco: Never  Vaping Use   Vaping status: Never Used  Substance Use Topics   Alcohol use: Yes    Comment: seldom   Drug use: No  "

## 2024-07-22 ENCOUNTER — Ambulatory Visit: Attending: Emergency Medicine | Admitting: Emergency Medicine

## 2024-07-22 ENCOUNTER — Encounter: Payer: Self-pay | Admitting: Emergency Medicine

## 2024-07-22 VITALS — BP 128/80 | HR 77 | Ht 67.0 in | Wt 300.0 lb

## 2024-07-22 DIAGNOSIS — E785 Hyperlipidemia, unspecified: Secondary | ICD-10-CM

## 2024-07-22 DIAGNOSIS — I10 Essential (primary) hypertension: Secondary | ICD-10-CM | POA: Diagnosis not present

## 2024-07-22 DIAGNOSIS — R079 Chest pain, unspecified: Secondary | ICD-10-CM | POA: Diagnosis not present

## 2024-07-22 MED ORDER — METOPROLOL TARTRATE 100 MG PO TABS: ORAL_TABLET | ORAL | 0 refills | Status: AC

## 2024-07-22 NOTE — Patient Instructions (Addendum)
 Medication Instructions:  TAKE METOPROLOL  TARTRATE 100 MG 2 HOURS PIROR TO YOUR CTA SCAN.  Lab Work: NUTRITIONAL THERAPIST AND CBC TO BE DONE TODAY.  Testing/Procedures:   Your cardiac CT will be scheduled at one of the below locations:   [facility] [address] [place], [place] [address] [phone] (Severe contrast allergies only)  OR   [facility] [address]  [place], [place] [address]   If scheduled at [facility] please arrive at the [facility] and Children's Entrance (Entrance C2) of [facility] 30 minutes prior to test start time. You can use the FREE valet parking offered at entrance C (encouraged to control the heart rate for the test)  Proceed to the [facility] Radiology Department (first floor) to check-in and test prep.  If scheduled at the Heart and V[facility] Tower at [address], please enter the parking lot using the [address] entrance and use the FREE valet service at the patient drop-off area. Enter the building and check-in with registration on the main floor.  Please follow these instructions carefully (unless otherwise directed):  An IV will be required for this test and Nitroglycerin  will be given.  Hold all erectile dysfunction medications at least 3 days (72 hrs) prior to test. (Ie viagra, cialis, sildenafil, tadalafil, etc)   On the Night Before the Test: Be sure to Drink plenty of water. Do not consume any caffeinated/decaffeinated beverages or chocolate 12 hours prior to your test. Do not take any antihistamines 12 hours prior to your test.  On the Day of the Test: Drink plenty of water until 1 hour prior to the test. Do not eat any food 1 hour prior to test. You may take your regular medications prior to the test.  Take METOPROLOL  TARTRATE (LOPRESSOR ) 100 MG two hours prior to test.      After the Test: Drink plenty of water. After receiving IV contrast, you may experience a mild flushed  feeling. This is normal. On occasion, you may experience a mild rash up to 24 hours after the test. This is not dangerous. If this occurs, you can take Benadryl 25 mg, Zyrtec , Claritin , or Allegra and increase your fluid intake. (Patients taking Tikosyn should avoid Benadryl, and may take Zyrtec , Claritin , or Allegra) If you experience trouble breathing, this can be serious. If it is severe call 911 IMMEDIATELY. If it is mild, please call our office.  We will call to schedule your test 2-4 weeks out understanding that some insurance companies will need an authorization prior to the service being performed.   For more information and frequently asked questions, please visit our website : [email or web address]  For non-scheduling related questions, please contact the cardiac imaging nurse navigator should you have any questions/concerns: Cardiac Imaging Nurse Navigators Direct Office Dial: [phone]   For scheduling needs, including cancellations and rescheduling, please call Brittany, [phone].   Follow-Up: At [facility], you and your health needs are our priority.  As part of our continuing mission to provide you with exceptional heart care, our providers are all part of one team.  This team includes your primary Cardiologist (physician) and Advanced Practice Providers or APPs (Physician Assistants and Nurse Practitioners) who all work together to provide you with the care you need, when you need it.  Your next appointment:   3 MONTHS  Provider:   MADISON FOUNTAIN, NP   Other Instructions:

## 2024-07-22 NOTE — Progress Notes (Signed)
 " Cardiology Office Note:    Date:  07/22/2024  ID:  Jerry [name], DOB Jan 11, 1976, MRN [id number] PCP: Jerry [name] Jerry [name]  [name] Cardiologist:  Jerry MARLA Red, MD Cardiology APP:  Jerry Lum CROME, NP       Patient Profile:       Chief Complaint: Hospital follow-up History of Present Illness:  Jerry [name] is a 49 y.o. male with visit-pertinent history of hypertension, hyperlipidemia, obesity, chronic back pain, ADHD  He was previously seen by Dr. Elmira for hypertension.  Echocardiogram in 2020 showed LVEF 59% with grade 1 DD and no significant valvular disease.  Renal artery ultrasound was attempted but could not visualize due to body habitus  He was recently seen in the hospital on 07/09/2024 by cardiology service for chest tightness and hypertension.  EKG appeared to be unchanged.  He was found to have gynecomastia on exam and spironolactone  was changed to eplerenone .  His losartan  was discontinued and he was started on irbesartan  300 mg daily.  He was started on carvedilol  3.125 mg twice daily.  He was also started on rosuvastatin  10 for an LDL 118 echocardiogram 07/10/2024 showed LVEF was preserved at 60 to 65%, no RWMA, grade 1 DD, RV function not well-visualized, no valvular abnormalities.   Discussed the use of AI scribe software for clinical note transcription with the patient, who gave verbal consent to proceed.  History of Present Illness Jerry [name] is a 49 year old male with hypertension who presents for follow-up after hospitalization for chest pain. He is accompanied by his wife.  Today patient tells me he is doing well without acute cardiovascular concerns or complaints.  Tells me his chest pains resolved during his hospitalization and have not recurred since he has been home.  He denies any dyspnea on exertion, orthopnea, or PND.  No syncope or presyncope.  He currently feels better on his new medication regimen and  reports home blood pressures average around 117/80.  He does have high cholesterol with an LDL of 118 which he attributes to poor diet and low activity.  He is a former smoker and drinks alcohol occasionally.  He works at [employer] and plans to resume a more active lifestyle including gym workouts.   Review of systems:  Please see the history of present illness. All other systems are reviewed and otherwise negative.      Studies Reviewed:    EKG Interpretation Date/Time:  Monday July 22 2024 10:58:02 EST Ventricular Rate:  77 PR Interval:  190 QRS Duration:  126 QT Interval:  392 QTC Calculation: 443 R Axis:   -55  Text Interpretation: Normal sinus rhythm Left axis deviation Left ventricular hypertrophy with QRS widening ( R in aVL , Cornell product , Romhilt-Estes ) When compared with ECG of 08-Jul-2024 18:03, Non-specific change in ST segment in Inferior leads T wave inversion no longer evident in Inferior leads Confirmed by Jerry Lum [phone]) on 07/22/2024 12:03:57 PM    Echocardiogram 07/10/2024  1. Extremely limited; LV function appears to be preserved.   2. Left ventricular ejection fraction, by estimation, is 60 to 65%. The  left ventricle has normal function. The left ventricle has no regional  wall motion abnormalities. Left ventricular diastolic parameters are  consistent with Grade I diastolic  dysfunction (impaired relaxation).   3. Right ventricular systolic function was not well visualized. The right  ventricular size is not well visualized.   4. The mitral valve is normal  in structure. No evidence of mitral valve  regurgitation. No evidence of mitral stenosis.   5. The aortic valve has an indeterminant number of cusps. Aortic valve  regurgitation is not visualized. No aortic stenosis is present.   6. The inferior vena cava is normal in size with greater than 50%  respiratory variability, suggesting right atrial pressure of 3 mmHg.   Risk  Assessment/Calculations:              Physical Exam:   VS:  BP 128/80 (BP Location: Left Arm, Patient Position: Sitting, Cuff Size: Large)   Pulse 77   Ht 5' 7 (1.702 m)   Wt 300 lb (136.1 kg)   BMI 46.99 kg/m    Wt Readings from Last 3 Encounters:  07/22/24 300 lb (136.1 kg)  07/10/24 295 lb 1.6 oz (133.9 kg)  01/02/24 295 lb (133.8 kg)    GEN: Well nourished, well developed in no acute distress NECK: No JVD; No carotid bruits CARDIAC: RRR, no murmurs, rubs, gallops RESPIRATORY:  Clear to auscultation without rales, wheezing or rhonchi  ABDOMEN: Soft, non-tender, non-distended EXTREMITIES:  No edema; No acute deformity      Assessment and Plan:  Chest pain Admitted 06/2024 with chest pain and elevated troponins and found to be in hypertensive urgency TTE 06/2024 showed preserved LV function - Today he reports prior chest pains has entirely resolved after adjusting his hypertensive regimen.  Home blood pressures have been much improved.  He denies exertional chest discomfort or dyspnea - Will plan for coronary CTA to screen for coronary artery disease - Continue rosuvastatin  20 mg daily - BMET and CBC today  Hypertension Admitted 06/2024 and found to be in hypertensive urgency Blood pressure today is under excellent control at 128/80 Home blood pressure readings averaging 110s/70s - Continue amlodipine  10 mg daily, carvedilol  3.125 mg twice daily, eplerenone  25 mg daily, hydralazine  100 mg twice daily, irbesartan  300 mg daily  Hyperlipidemia LDL 118 on 04/2024 Subsequently started on rosuvastatin  on 06/2024 - Plan for repeat lipid panel and LFTs at follow-up visit - Continue rosuvastatin  20 mg daily      Dispo:  Return in about 3 months (around 10/20/2024).  Signed, Lum [name] Louis, NP  "

## 2024-07-23 LAB — BASIC METABOLIC PANEL WITH GFR
BUN/Creatinine Ratio: 13 (ref 9–20)
BUN: 17 mg/dL (ref 6–24)
CO2: 21 mmol/L (ref 20–29)
Calcium: 9 mg/dL (ref 8.7–10.2)
Chloride: 103 mmol/L (ref 96–106)
Creatinine, Ser: 1.27 mg/dL (ref 0.76–1.27)
Glucose: 88 mg/dL (ref 70–99)
Potassium: 4.2 mmol/L (ref 3.5–5.2)
Sodium: 138 mmol/L (ref 134–144)
eGFR: 69 mL/min/1.73

## 2024-07-23 LAB — CBC
Hematocrit: 41.2 % (ref 37.5–51.0)
Hemoglobin: 13.6 g/dL (ref 13.0–17.7)
MCH: 30.1 pg (ref 26.6–33.0)
MCHC: 33 g/dL (ref 31.5–35.7)
MCV: 91 fL (ref 79–97)
Platelets: 178 x10E3/uL (ref 150–450)
RBC: 4.52 x10E6/uL (ref 4.14–5.80)
RDW: 13.5 % (ref 11.6–15.4)
WBC: 5 x10E3/uL (ref 3.4–10.8)

## 2024-07-24 ENCOUNTER — Ambulatory Visit: Payer: Self-pay | Admitting: Emergency Medicine

## 2024-08-02 ENCOUNTER — Emergency Department (HOSPITAL_COMMUNITY)

## 2024-08-02 ENCOUNTER — Emergency Department (HOSPITAL_COMMUNITY)
Admission: EM | Admit: 2024-08-02 | Discharge: 2024-08-03 | Disposition: A | Attending: Emergency Medicine | Admitting: Emergency Medicine

## 2024-08-02 DIAGNOSIS — Z7951 Long term (current) use of inhaled steroids: Secondary | ICD-10-CM | POA: Insufficient documentation

## 2024-08-02 DIAGNOSIS — R0602 Shortness of breath: Secondary | ICD-10-CM | POA: Insufficient documentation

## 2024-08-02 DIAGNOSIS — Z7982 Long term (current) use of aspirin: Secondary | ICD-10-CM | POA: Diagnosis not present

## 2024-08-02 DIAGNOSIS — R079 Chest pain, unspecified: Secondary | ICD-10-CM | POA: Insufficient documentation

## 2024-08-02 DIAGNOSIS — Z79899 Other long term (current) drug therapy: Secondary | ICD-10-CM | POA: Diagnosis not present

## 2024-08-02 DIAGNOSIS — E119 Type 2 diabetes mellitus without complications: Secondary | ICD-10-CM | POA: Insufficient documentation

## 2024-08-02 DIAGNOSIS — J069 Acute upper respiratory infection, unspecified: Secondary | ICD-10-CM

## 2024-08-02 DIAGNOSIS — R42 Dizziness and giddiness: Secondary | ICD-10-CM | POA: Diagnosis not present

## 2024-08-02 DIAGNOSIS — I10 Essential (primary) hypertension: Secondary | ICD-10-CM | POA: Diagnosis not present

## 2024-08-02 DIAGNOSIS — J45909 Unspecified asthma, uncomplicated: Secondary | ICD-10-CM | POA: Diagnosis not present

## 2024-08-02 LAB — CBC WITH DIFFERENTIAL/PLATELET
Abs Immature Granulocytes: 0.02 K/uL (ref 0.00–0.07)
Basophils Absolute: 0 K/uL (ref 0.0–0.1)
Basophils Relative: 1 %
Eosinophils Absolute: 0.3 K/uL (ref 0.0–0.5)
Eosinophils Relative: 4 %
HCT: 39.1 % (ref 39.0–52.0)
Hemoglobin: 13.1 g/dL (ref 13.0–17.0)
Immature Granulocytes: 0 %
Lymphocytes Relative: 30 %
Lymphs Abs: 1.9 K/uL (ref 0.7–4.0)
MCH: 30.3 pg (ref 26.0–34.0)
MCHC: 33.5 g/dL (ref 30.0–36.0)
MCV: 90.5 fL (ref 80.0–100.0)
Monocytes Absolute: 0.6 K/uL (ref 0.1–1.0)
Monocytes Relative: 10 %
Neutro Abs: 3.5 K/uL (ref 1.7–7.7)
Neutrophils Relative %: 55 %
Platelets: 167 K/uL (ref 150–400)
RBC: 4.32 MIL/uL (ref 4.22–5.81)
RDW: 14 % (ref 11.5–15.5)
WBC: 6.4 K/uL (ref 4.0–10.5)
nRBC: 0 % (ref 0.0–0.2)

## 2024-08-02 LAB — BASIC METABOLIC PANEL WITH GFR
Anion gap: 10 (ref 5–15)
BUN: 13 mg/dL (ref 6–20)
CO2: 23 mmol/L (ref 22–32)
Calcium: 9.1 mg/dL (ref 8.9–10.3)
Chloride: 106 mmol/L (ref 98–111)
Creatinine, Ser: 1.1 mg/dL (ref 0.61–1.24)
GFR, Estimated: >60 mL/min
Glucose, Bld: 82 mg/dL (ref 70–99)
Potassium: 3.9 mmol/L (ref 3.5–5.1)
Sodium: 139 mmol/L (ref 135–145)

## 2024-08-02 LAB — RESP PANEL BY RT-PCR (RSV, FLU A&B, COVID)  RVPGX2
Influenza A by PCR: NEGATIVE
Influenza B by PCR: NEGATIVE
Resp Syncytial Virus by PCR: NEGATIVE
SARS Coronavirus 2 by RT PCR: NEGATIVE

## 2024-08-02 LAB — TROPONIN T, HIGH SENSITIVITY: Troponin T High Sensitivity: 33 ng/L — ABNORMAL HIGH (ref 0–19)

## 2024-08-02 MED ORDER — SODIUM CHLORIDE 0.9 % IV BOLUS
1000.0000 mL | Freq: Once | INTRAVENOUS | Status: AC
  Administered 2024-08-02: 1000 mL via INTRAVENOUS

## 2024-08-02 NOTE — ED Provider Notes (Signed)
 " [facility] AT [facility] Provider Note  CSN: [id number] Arrival date & time: 08/02/24 1621  Chief Complaint(s) Shortness of Breath and Dizziness  HPI Jerry [name] is a 49 y.o. male who is here today for lightheadedness.  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  He began to feel lightheaded.  Did not have any pain in his chest.  Triage note reports the patient had shortness of breath, however he denies this to me.  He does have a history of asthma.  Patient with a recent hospitalization for atypical chest pain, followed up with cardiology.  Feels a bit better sitting in bed.   Past Medical History Past Medical History:  Diagnosis Date   Chronic back pain    Hypertension    Patient Active Problem List   Diagnosis Date Noted   Elevated troponin 07/10/2024   Acute chest pain 07/09/2024   Sciatica 07/09/2024   Lumbar radiculopathy 09/14/2020   Leg edema 05/24/2019   Diabetes mellitus (HCC) 04/19/2019   Gastroesophageal reflux disease without esophagitis 08/12/2018   Obesity, morbid, BMI 50 or higher (HCC) 09/15/2014   Essential hypertension 10/20/2008   Home Medication(s) Prior to Admission medications  Medication Sig Start Date End Date Taking? Authorizing Provider  albuterol  (VENTOLIN  HFA) 108 (90 Base) MCG/ACT inhaler Inhale 1-2 puffs into the lungs every 6 (six) hours as needed for wheezing or shortness of breath. 08/25/20   Wieters, Hallie C, PA-C  amLODipine  (NORVASC ) 10 MG tablet Take 10 mg by mouth daily.    [provider]  aspirin  EC 81 MG tablet Take 1 tablet (81 mg total) by mouth daily. Swallow whole. 07/11/24   Harrie Bruckner, DO  atomoxetine  (STRATTERA ) 25 MG capsule Take 25 mg by mouth every morning.    [provider]  BREO ELLIPTA  100-25 MCG/ACT AEPB Inhale 1 puff into the lungs daily.    [provider]  carvedilol  (COREG ) 3.125 MG tablet Take 1 tablet (3.125 mg total) by mouth  2 (two) times daily with a meal. 07/10/24   Juberg, Christopher, DO  divalproex  (DEPAKOTE  ER) 500 MG 24 hr tablet Take 500 mg by mouth 2 (two) times daily.    [provider]  eplerenone  (INSPRA ) 25 MG tablet Take 1 tablet (25 mg total) by mouth daily. 07/11/24   Harrie Bruckner, DO  famotidine  (PEPCID ) 20 MG tablet Take 20 mg by mouth daily. 04/17/24 04/17/25  [provider]  fluticasone  (FLONASE ) 50 MCG/ACT nasal spray Place 1 spray into both nostrils daily. Patient taking differently: Place 1 spray into both nostrils daily as needed for allergies or rhinitis. 07/11/20   Hall-Potvin, Brittany, PA-C  hydrALAZINE  (APRESOLINE ) 100 MG tablet Take 100 mg by mouth 2 (two) times daily.     [provider]  irbesartan  (AVAPRO ) 300 MG tablet Take 1 tablet (300 mg total) by mouth daily. 07/11/24   Harrie Bruckner, DO  levocetirizine (XYZAL ) 5 MG tablet Take 5 mg by mouth every evening. 03/24/24   [provider]  metoprolol  tartrate (LOPRESSOR ) 100 MG tablet TAKE 100 MG (1 TABLET) 2 HOURS PRIOR TO YOUR CTA SCAN. 07/22/24   Rana Lum CROME, NP  pantoprazole  (PROTONIX ) 40 MG tablet Take 40 mg by mouth daily. 04/17/24 04/17/25  [provider]  rosuvastatin  (CRESTOR ) 20 MG tablet Take 1 tablet (20 mg total) by mouth daily. 07/11/24   Harrie Bruckner, DO  tamsulosin  (FLOMAX ) 0.4 MG CAPS capsule Take 0.4 mg by mouth daily.  08/05/20   [provider]                                                                                                                                    Past Surgical History Past Surgical History:  Procedure Laterality Date   ACHILLES TENDON SURGERY     LUMBAR LAMINECTOMY/DECOMPRESSION MICRODISCECTOMY Right 09/14/2020   Procedure: Microdiscectomy - right - L4-L5;  Surgeon: Louis Shove, MD;  Location: [facility] OR;  Service: Neurosurgery;  Laterality: Right;  3C   TOE SURGERY     TONSILLECTOMY     Family History Family History   Problem Relation Age of Onset   Heart attack Maternal Grandmother    Heart attack Paternal Grandmother     Social History Social History[1] Allergies Gabapentin  Review of Systems Review of Systems  Physical Exam Vital Signs  I have reviewed the triage vital signs BP (!) 141/81 (BP Location: Left Arm)   Pulse 63   Temp 97.6 F (36.4 C) (Oral)   Resp 12   SpO2 99%   Physical Exam Vitals and nursing note reviewed.  HENT:     Head: Normocephalic and atraumatic.  Cardiovascular:     Rate and Rhythm: Normal rate.  Pulmonary:     Effort: Pulmonary effort is normal.     Breath sounds: No decreased breath sounds or wheezing.  Chest:     Chest wall: No mass or tenderness.  Abdominal:     Palpations: Abdomen is soft.  Musculoskeletal:        General: Normal range of motion.  Neurological:     General: No focal deficit present.     Mental Status: He is alert.  Psychiatric:        Mood and Affect: Mood normal.     ED Results and Treatments Labs (all labs ordered are listed, but only abnormal results are displayed) Labs Reviewed  TROPONIN T, HIGH SENSITIVITY - Abnormal; Notable for the following components:      Result Value   Troponin T High Sensitivity 33 (*)    All other components within normal limits  RESP PANEL BY RT-PCR (RSV, FLU A&B, COVID)  RVPGX2  BASIC METABOLIC PANEL WITH GFR  CBC WITH DIFFERENTIAL/PLATELET  TROPONIN T, HIGH SENSITIVITY  Radiology DG Chest 2 View Result Date: 08/02/2024 EXAM: 2 VIEW(S) XRAY OF THE CHEST 08/02/2024 05:39:00 PM COMPARISON: 07/08/2024 CLINICAL HISTORY: Shortness of breath. FINDINGS: LUNGS AND PLEURA: Underinflation. Linear opacity at left lung base, likely scar or atelectasis, similar to previous. No pleural effusion. No pneumothorax. HEART AND MEDIASTINUM: Overlapping cardiac leads. No acute abnormality  of the cardiac and mediastinal silhouettes. BONES AND SOFT TISSUES: Degenerative changes in spine. No acute osseous abnormality. IMPRESSION: 1. No acute findings. 2. Linear opacity at the left lung base, likely scar or atelectasis, similar to previous. Electronically signed by: Elsie Gravely MD 08/02/2024 05:48 PM EST RP Workstation: [id number]    Pertinent labs & imaging results that were available during my care of the patient were reviewed by me and considered in my medical decision making (see MDM for details).  Medications Ordered in ED Medications  sodium chloride  0.9 % bolus 1,000 mL (1,000 mLs Intravenous New Bag/Given 08/02/24 2120)                                                                                                                                     Procedures Procedures  (including critical care time)  Medical Decision Making / ED Course   This patient presents to the ED for concern of near syncope, this involves an extensive number of treatment options, and is a complaint that carries with it a high risk of complications and morbidity.  The differential diagnosis includes arrhythmia, ACS, dehydration, less likely PE, consider AAA.  MDM: Patient overall looks quite well, has reassuring vital signs.  His EKG shows a fascicular block, no changes from prior EKG on the 12th of this month.  Will check blood work on the patient, troponin, provide IV fluids.  Reassessment 11:55 PM-patient's initial Trope mildly elevated at 33, however this is down from prior.  Signed out to Dr. Johanna pending repeat troponin.   Additional history obtained:  -External records from outside source obtained and reviewed including: Chart review including previous notes, labs, imaging, consultation notes   Lab Tests: -I ordered, reviewed, and interpreted labs.   The pertinent results include:   Labs Reviewed  TROPONIN T, HIGH SENSITIVITY - Abnormal; Notable for the following components:       Result Value   Troponin T High Sensitivity 33 (*)    All other components within normal limits  RESP PANEL BY RT-PCR (RSV, FLU A&B, COVID)  RVPGX2  BASIC METABOLIC PANEL WITH GFR  CBC WITH DIFFERENTIAL/PLATELET  TROPONIN T, HIGH SENSITIVITY      EKG occasional PVCs, no acute ischemia.  EKG Interpretation Date/Time:  Friday August 02 2024 21:12:33 EST Ventricular Rate:  65 PR Interval:  199 QRS Duration:  118 QT Interval:  424 QTC Calculation: 441 R Axis:   -58  Text Interpretation: Sinus rhythm Ventricular premature complex Probable left atrial enlargement Incomplete RBBB and LAFB Left ventricular hypertrophy Confirmed by Gravely Pac [phone])  on 08/02/2024 11:19:21 PM         Imaging Studies ordered: I ordered imaging studies including chest x-ray I independently visualized and interpreted imaging. I agree with the radiologist interpretation   Medicines ordered and prescription drug management: Meds ordered this encounter  Medications   sodium chloride  0.9 % bolus 1,000 mL    -I have reviewed the patients home medicines and have made adjustments as needed  Cardiac Monitoring: The patient was maintained on a cardiac monitor.  I personally viewed and interpreted the cardiac monitored which showed an underlying rhythm of: Normal sinus rhythm  Social Determinants of Health:  Factors impacting patients care include: Lack of access to primary care   Reevaluation: After the interventions noted above, I reevaluated the patient and found that they have :improved  Co morbidities that complicate the patient evaluation  Past Medical History:  Diagnosis Date   Chronic back pain    Hypertension         Final Clinical Impression(s) / ED Diagnoses Final diagnoses:  Chest pain, unspecified type     @PCDICTATION @     [1]  Social History Tobacco Use   Smoking status: Former    Types: Cigarettes   Smokeless tobacco: Never   Tobacco comments:    few a  day   Vaping Use   Vaping status: Never Used  Substance Use Topics   Alcohol use: Never   Drug use: Never     Mannie Pac T, DO 08/02/24 2356  "

## 2024-08-02 NOTE — ED Triage Notes (Signed)
 Patient BIBA coming from [employer], was at work c/o SOB/dizziness, hx of asthma, 99% RA. Patient is alert and oriented x 4. Airway patent, respirations even and unlabored. Skin normal, warm and dry. Duo neb x 1 given en route.

## 2024-08-02 NOTE — ED Provider Triage Note (Signed)
 Emergency Medicine Provider Triage Evaluation Note  Jerry [name] , a 49 y.o. male  was evaluated in triage.  Pt complains of shortness of breath.  Shortness of breath.  Patient has some lightheadedness and dizziness.  Patient reports that he did not pass out and he did not lose consciousness.  He is still having some shortness of breath however denies any chest pain or any other pain.  Physical exam lung sounds were clear Review of Systems  Positive: Shortness of breath Negative:   Physical Exam  BP 117/83 (BP Location: Left Arm)   Pulse 72   Temp 97.9 F (36.6 C) (Oral)   Resp 20   SpO2 98%  Gen:   Awake, no distress   Resp:  Normal effort  MSK:   Moves extremities without difficulty  Other:   Medical Decision Making  Medically screening exam initiated at 5:13 PM.  Appropriate orders placed.  Jerry [name] was informed that the remainder of the evaluation will be completed by another provider, this initial triage assessment does not replace that evaluation, and the importance of remaining in the ED until their evaluation is complete.     Jerry [name], [place] 08/02/24 1715

## 2024-08-02 NOTE — Discharge Instructions (Addendum)
 While you were in the emergency room, you had blood work done that overall was normal.  Your chest x-ray was normal.  Please follow-up with your cardiologist at your neck scheduled appointment.  Please return to the emergency room if you develop pain in your chest, difficulty with breathing or lose consciousness.

## 2024-08-03 LAB — TROPONIN T, HIGH SENSITIVITY: Troponin T High Sensitivity: 35 ng/L — ABNORMAL HIGH (ref 0–19)

## 2024-08-03 MED ORDER — LORATADINE 10 MG PO TABS
10.0000 mg | ORAL_TABLET | Freq: Once | ORAL | Status: AC
  Administered 2024-08-03: 10 mg via ORAL
  Filled 2024-08-03: qty 1

## 2024-08-03 NOTE — ED Notes (Signed)
 Patient spoke with provider about leaving. Provider wanted patient to stay however patient did not want to because of work. MD provided patient with risks of leaving AMA and patient was understanding. Patient had AVS and this writer went over them explaining that he is to keep the cardiologist appt he has and had strict return precautions if symptoms worsened. Patient was understanding of same. Patient was encouraged to stay however patient was still wanting to leave.

## 2024-08-03 NOTE — ED Provider Notes (Signed)
 1:48 AM Patient has symptoms with minimal exertion and has elevated troponins but is refusing admission.    Risks and benefits of admission discussed.  Risks of leaving are death and heart attack.  Patient is AO4 and understands these risks.  I have advised to shoveling snow or ice.  Verbalizes understanding  Would like something for his cold.    Patient will sign out against medical advice.  Welcome to return at any time   [facility], Jermika Olden, MD 08/03/24 0151

## 2024-08-03 NOTE — ED Notes (Signed)
 Patient came out of room and sat in a chair in the hallway stating he wants to leave. NT notified provider of same as this writer was in another room doing patient care. Patient was instructed to wait in the room until provider came to see him. Patient stated he wants to leave and wanted a doctors note.

## 2024-08-03 NOTE — ED Notes (Signed)
 Patient only received 200cc approx from bolus. Patient kept bending arm making fluids hard to flow in. Patient then disconnected from them and wanted to leave.

## 2024-08-08 ENCOUNTER — Other Ambulatory Visit (HOSPITAL_COMMUNITY): Payer: Self-pay

## 2024-08-13 ENCOUNTER — Telehealth (HOSPITAL_COMMUNITY): Payer: Self-pay | Admitting: Emergency Medicine

## 2024-08-13 NOTE — Telephone Encounter (Signed)
 Reaching out to patient to offer assistance regarding upcoming cardiac imaging study; pt verbalizes understanding of appt date/time, parking situation and where to check in, pre-test NPO status and medications ordered, and verified current allergies; name and call back number provided for further questions should they arise Rockwell Alexandria RN Navigator Cardiac Imaging [facility] [phone] office [phone] cell

## 2024-08-14 ENCOUNTER — Ambulatory Visit (HOSPITAL_COMMUNITY)
Admission: RE | Admit: 2024-08-14 | Discharge: 2024-08-14 | Disposition: A | Source: Ambulatory Visit | Attending: Emergency Medicine | Admitting: Emergency Medicine

## 2024-08-14 ENCOUNTER — Other Ambulatory Visit: Payer: Self-pay | Admitting: Internal Medicine

## 2024-08-14 ENCOUNTER — Ambulatory Visit (HOSPITAL_COMMUNITY)
Admission: RE | Admit: 2024-08-14 | Discharge: 2024-08-14 | Disposition: A | Source: Ambulatory Visit | Attending: Internal Medicine

## 2024-08-14 ENCOUNTER — Ambulatory Visit: Payer: Self-pay | Admitting: Emergency Medicine

## 2024-08-14 DIAGNOSIS — R079 Chest pain, unspecified: Secondary | ICD-10-CM

## 2024-08-14 DIAGNOSIS — R931 Abnormal findings on diagnostic imaging of heart and coronary circulation: Secondary | ICD-10-CM

## 2024-08-14 MED ORDER — NITROGLYCERIN 0.4 MG SL SUBL
0.8000 mg | SUBLINGUAL_TABLET | Freq: Once | SUBLINGUAL | Status: AC
  Administered 2024-08-14: 0.8 mg via SUBLINGUAL

## 2024-08-14 MED ORDER — IOHEXOL 350 MG/ML SOLN
100.0000 mL | Freq: Once | INTRAVENOUS | Status: AC | PRN
  Administered 2024-08-14: 100 mL via INTRAVENOUS

## 2024-08-14 NOTE — Progress Notes (Signed)
 Patient has been called. Spoke with pt and wife regarding results. Patient has been schedule to see DOD per Lum Louis to discuss cath

## 2024-08-15 ENCOUNTER — Other Ambulatory Visit (HOSPITAL_COMMUNITY): Payer: Self-pay

## 2024-08-19 ENCOUNTER — Ambulatory Visit: Admitting: Cardiovascular Disease

## 2024-08-20 ENCOUNTER — Ambulatory Visit: Admitting: Cardiology

## 2024-10-30 ENCOUNTER — Ambulatory Visit: Admitting: Emergency Medicine
